# Patient Record
Sex: Male | Born: 1960 | Race: White | Hispanic: No | Marital: Single | Smoking: Current every day smoker
Health system: Southern US, Community
[De-identification: ages and names within clinical notes are randomized; demographics above are authoritative.]

## PROBLEM LIST (undated history)

## (undated) DIAGNOSIS — F329 Major depressive disorder, single episode, unspecified: Secondary | ICD-10-CM

## (undated) DIAGNOSIS — I209 Angina pectoris, unspecified: Secondary | ICD-10-CM

## (undated) DIAGNOSIS — F419 Anxiety disorder, unspecified: Secondary | ICD-10-CM

## (undated) DIAGNOSIS — IMO0001 Reserved for inherently not codable concepts without codable children: Secondary | ICD-10-CM

## (undated) DIAGNOSIS — F32A Depression, unspecified: Secondary | ICD-10-CM

## (undated) DIAGNOSIS — I2510 Atherosclerotic heart disease of native coronary artery without angina pectoris: Secondary | ICD-10-CM

## (undated) DIAGNOSIS — I509 Heart failure, unspecified: Secondary | ICD-10-CM

## (undated) DIAGNOSIS — I639 Cerebral infarction, unspecified: Secondary | ICD-10-CM

## (undated) DIAGNOSIS — I10 Essential (primary) hypertension: Secondary | ICD-10-CM

## (undated) DIAGNOSIS — C801 Malignant (primary) neoplasm, unspecified: Secondary | ICD-10-CM

## (undated) DIAGNOSIS — I219 Acute myocardial infarction, unspecified: Secondary | ICD-10-CM

## (undated) HISTORY — PX: LIVER SURGERY: SHX698

## (undated) HISTORY — PX: CORONARY ANGIOPLASTY WITH STENT PLACEMENT: SHX49

---

## 2005-02-26 ENCOUNTER — Emergency Department: Payer: Self-pay | Admitting: Emergency Medicine

## 2008-09-11 ENCOUNTER — Inpatient Hospital Stay: Payer: Self-pay | Admitting: Specialist

## 2009-06-14 ENCOUNTER — Emergency Department: Payer: Self-pay | Admitting: Emergency Medicine

## 2010-02-09 ENCOUNTER — Emergency Department: Payer: Self-pay | Admitting: Emergency Medicine

## 2010-09-03 ENCOUNTER — Ambulatory Visit: Payer: Self-pay | Admitting: Adult Health

## 2010-09-20 ENCOUNTER — Ambulatory Visit: Payer: Self-pay | Admitting: Adult Health

## 2012-11-11 DIAGNOSIS — E669 Obesity, unspecified: Secondary | ICD-10-CM | POA: Insufficient documentation

## 2012-11-11 DIAGNOSIS — I2510 Atherosclerotic heart disease of native coronary artery without angina pectoris: Secondary | ICD-10-CM | POA: Insufficient documentation

## 2013-10-02 DIAGNOSIS — G459 Transient cerebral ischemic attack, unspecified: Secondary | ICD-10-CM | POA: Insufficient documentation

## 2013-10-02 DIAGNOSIS — I10 Essential (primary) hypertension: Secondary | ICD-10-CM | POA: Insufficient documentation

## 2013-10-02 DIAGNOSIS — M129 Arthropathy, unspecified: Secondary | ICD-10-CM | POA: Insufficient documentation

## 2013-11-29 ENCOUNTER — Ambulatory Visit: Payer: Self-pay | Admitting: Pain Medicine

## 2013-12-02 ENCOUNTER — Ambulatory Visit: Payer: Self-pay | Admitting: Pain Medicine

## 2013-12-02 LAB — MAGNESIUM: Magnesium: 1.9 mg/dL

## 2013-12-02 LAB — SEDIMENTATION RATE: ERYTHROCYTE SED RATE: 4 mm/h (ref 0–20)

## 2013-12-05 ENCOUNTER — Ambulatory Visit: Payer: Self-pay | Admitting: Pain Medicine

## 2013-12-13 ENCOUNTER — Ambulatory Visit: Payer: Self-pay | Admitting: Rheumatology

## 2013-12-26 DIAGNOSIS — G8929 Other chronic pain: Secondary | ICD-10-CM | POA: Insufficient documentation

## 2013-12-30 ENCOUNTER — Ambulatory Visit: Payer: Self-pay | Admitting: Pain Medicine

## 2014-01-08 DIAGNOSIS — R0602 Shortness of breath: Secondary | ICD-10-CM | POA: Insufficient documentation

## 2014-01-08 DIAGNOSIS — I209 Angina pectoris, unspecified: Secondary | ICD-10-CM | POA: Insufficient documentation

## 2014-01-09 ENCOUNTER — Ambulatory Visit: Payer: Self-pay | Admitting: Pain Medicine

## 2014-01-29 ENCOUNTER — Ambulatory Visit: Payer: Self-pay | Admitting: Pain Medicine

## 2014-02-06 ENCOUNTER — Ambulatory Visit: Payer: Self-pay | Admitting: Pain Medicine

## 2014-02-26 ENCOUNTER — Ambulatory Visit: Payer: Self-pay | Admitting: Pain Medicine

## 2014-03-03 DIAGNOSIS — G4700 Insomnia, unspecified: Secondary | ICD-10-CM | POA: Insufficient documentation

## 2014-03-13 ENCOUNTER — Ambulatory Visit: Payer: Self-pay | Admitting: Pain Medicine

## 2014-04-09 ENCOUNTER — Ambulatory Visit: Payer: Self-pay | Admitting: Pain Medicine

## 2014-04-10 ENCOUNTER — Other Ambulatory Visit: Payer: Self-pay | Admitting: Pain Medicine

## 2014-04-10 LAB — HEPATIC FUNCTION PANEL A (ARMC)
ALBUMIN: 3.8 g/dL (ref 3.4–5.0)
AST: 14 U/L — AB (ref 15–37)
Alkaline Phosphatase: 67 U/L
BILIRUBIN TOTAL: 0.3 mg/dL (ref 0.2–1.0)
Bilirubin, Direct: <0.1 mg/dL (ref 0.00–0.20)
SGPT (ALT): 30 U/L
TOTAL PROTEIN: 7.2 g/dL (ref 6.4–8.2)

## 2014-04-10 LAB — BASIC METABOLIC PANEL
Anion Gap: 9 (ref 7–16)
BUN: 13 mg/dL (ref 7–18)
CALCIUM: 8.9 mg/dL (ref 8.5–10.1)
CHLORIDE: 107 mmol/L (ref 98–107)
Co2: 24 mmol/L (ref 21–32)
Creatinine: 0.87 mg/dL (ref 0.60–1.30)
EGFR (African American): >60
EGFR (Non-African Amer.): >60
Glucose: 101 mg/dL — ABNORMAL HIGH (ref 65–99)
OSMOLALITY: 280 (ref 275–301)
Potassium: 3.8 mmol/L (ref 3.5–5.1)
SODIUM: 140 mmol/L (ref 136–145)

## 2014-04-29 ENCOUNTER — Ambulatory Visit: Payer: Self-pay | Admitting: Pain Medicine

## 2014-05-19 ENCOUNTER — Ambulatory Visit: Payer: Self-pay | Admitting: Pain Medicine

## 2014-05-27 ENCOUNTER — Ambulatory Visit: Payer: Self-pay | Admitting: Pain Medicine

## 2014-06-11 ENCOUNTER — Ambulatory Visit: Payer: Self-pay | Admitting: Pain Medicine

## 2014-08-29 ENCOUNTER — Ambulatory Visit: Payer: Self-pay | Admitting: Pain Medicine

## 2014-12-15 DIAGNOSIS — F418 Other specified anxiety disorders: Secondary | ICD-10-CM | POA: Insufficient documentation

## 2015-01-14 DIAGNOSIS — E7849 Other hyperlipidemia: Secondary | ICD-10-CM | POA: Insufficient documentation

## 2015-03-13 ENCOUNTER — Emergency Department: Payer: Medicaid Other

## 2015-03-13 ENCOUNTER — Emergency Department
Admission: EM | Admit: 2015-03-13 | Discharge: 2015-03-13 | Disposition: A | Discharge status: Home / Self Care | Payer: Medicaid Other | Attending: Emergency Medicine | Admitting: Emergency Medicine

## 2015-03-13 DIAGNOSIS — Z7902 Long term (current) use of antithrombotics/antiplatelets: Secondary | ICD-10-CM | POA: Diagnosis not present

## 2015-03-13 DIAGNOSIS — W11XXXA Fall on and from ladder, initial encounter: Secondary | ICD-10-CM | POA: Diagnosis not present

## 2015-03-13 DIAGNOSIS — S3992XA Unspecified injury of lower back, initial encounter: Secondary | ICD-10-CM | POA: Diagnosis not present

## 2015-03-13 DIAGNOSIS — Y998 Other external cause status: Secondary | ICD-10-CM | POA: Diagnosis not present

## 2015-03-13 DIAGNOSIS — S8262XA Displaced fracture of lateral malleolus of left fibula, initial encounter for closed fracture: Secondary | ICD-10-CM | POA: Insufficient documentation

## 2015-03-13 DIAGNOSIS — Z79899 Other long term (current) drug therapy: Secondary | ICD-10-CM | POA: Insufficient documentation

## 2015-03-13 DIAGNOSIS — I10 Essential (primary) hypertension: Secondary | ICD-10-CM | POA: Insufficient documentation

## 2015-03-13 DIAGNOSIS — Y9389 Activity, other specified: Secondary | ICD-10-CM | POA: Diagnosis not present

## 2015-03-13 DIAGNOSIS — Z7951 Long term (current) use of inhaled steroids: Secondary | ICD-10-CM | POA: Insufficient documentation

## 2015-03-13 DIAGNOSIS — Z720 Tobacco use: Secondary | ICD-10-CM | POA: Insufficient documentation

## 2015-03-13 DIAGNOSIS — S99912A Unspecified injury of left ankle, initial encounter: Secondary | ICD-10-CM | POA: Diagnosis present

## 2015-03-13 DIAGNOSIS — Y9289 Other specified places as the place of occurrence of the external cause: Secondary | ICD-10-CM | POA: Insufficient documentation

## 2015-03-13 DIAGNOSIS — S82892A Other fracture of left lower leg, initial encounter for closed fracture: Secondary | ICD-10-CM

## 2015-03-13 HISTORY — DX: Essential (primary) hypertension: I10

## 2015-03-13 HISTORY — DX: Atherosclerotic heart disease of native coronary artery without angina pectoris: I25.10

## 2015-03-13 HISTORY — DX: Cerebral infarction, unspecified: I63.9

## 2015-03-13 HISTORY — DX: Acute myocardial infarction, unspecified: I21.9

## 2015-03-13 MED ORDER — OXYCODONE-ACETAMINOPHEN 5-325 MG PO TABS: 1.0000 | ORAL_TABLET | ORAL | Status: DC | PRN

## 2015-03-13 MED ORDER — ONDANSETRON HCL 4 MG/2ML IJ SOLN
4.0000 mg | Freq: Once | INTRAMUSCULAR | Status: AC
  Administered 2015-03-13: 4 mg via INTRAVENOUS
  Filled 2015-03-13: qty 2

## 2015-03-13 MED ORDER — MORPHINE SULFATE 4 MG/ML IJ SOLN
4.0000 mg | Freq: Once | INTRAMUSCULAR | Status: AC
  Administered 2015-03-13: 4 mg via INTRAVENOUS
  Filled 2015-03-13: qty 1

## 2015-03-13 NOTE — ED Notes (Signed)
Pt discharged home after verbalizing understanding of discharge instructions; nad noted. 

## 2015-03-13 NOTE — ED Provider Notes (Signed)
[facility] Emergency Department Provider Note    ____________________________________________  Time seen: 1020  I have reviewed the triage vital signs and the nursing notes.   HISTORY  Chief Complaint Fall   History limited by: Not Limited   HPI Trevor Rose is a 54 y.o. male who presents to the emergency department today complaining primarily of left ankle pain. The patient states that a couple of hours ago he was up on a ladder when the ladder slipped on the wet grassy surface. He states he fell down the ladder but caught his left ankle. He then started experiencing severe sharp left ankle pain. It does radiate up slightly. He denies hitting his head or blacking out. He denies any head or neck pain. He states that his back pain is his chronic back pain.     Past Medical History  Diagnosis Date  . Hypertension   . Coronary artery disease   . Stroke   . MI (myocardial infarction)     There are no active problems to display for this patient.   Past Surgical History  Procedure Laterality Date  . Coronary angioplasty with stent placement      Current Outpatient Rx  Name  Route  Sig  Dispense  Refill  . acetaminophen (TYLENOL) 500 MG tablet   Oral   Take 1,000 mg by mouth every 6 (six) hours as needed for mild pain.         . budesonide-formoterol (SYMBICORT) 80-4.5 MCG/ACT inhaler   Inhalation   Inhale 2 puffs into the lungs 2 (two) times daily.         . clopidogrel (PLAVIX) 75 MG tablet   Oral   Take 1 tablet by mouth daily.         Marland Kitchen gabapentin (NEURONTIN) 100 MG capsule   Oral   Take 2 capsules by mouth every morning.         Marland Kitchen ipratropium-albuterol (DUONEB) 0.5-2.5 (3) MG/3ML SOLN   Inhalation   Inhale 3 mLs into the lungs 4 (four) times daily as needed.         Marland Kitchen lisinopril (PRINIVIL,ZESTRIL) 5 MG tablet   Oral   Take 1 tablet by mouth daily.         Marland Kitchen lovastatin (MEVACOR) 40 MG tablet   Oral   Take 1 tablet  by mouth daily.           Allergies Review of patient's allergies indicates no known allergies.  No family history on file.  Social History History  Substance Use Topics  . Smoking status: Current Every Day Smoker -- 1.00 packs/day    Types: Cigarettes  . Smokeless tobacco: Not on file  . Alcohol Use: No    Review of Systems  Constitutional: Negative for fever. Cardiovascular: Negative for chest pain. Respiratory: Negative for shortness of breath. Gastrointestinal: Negative for abdominal pain, vomiting and diarrhea. Genitourinary: Negative for dysuria. Musculoskeletal: Positive for back pain, positive for left ankle pain Skin: Negative for rash. Neurological: Negative for headaches, focal weakness or numbness.   10-point ROS otherwise negative.  ____________________________________________   PHYSICAL EXAM:  VITAL SIGNS: ED Triage Vitals  Enc Vitals Group     BP 03/13/15 0916 154/88 mmHg     Pulse Rate 03/13/15 0916 66     Resp 03/13/15 0916 22     Temp 03/13/15 0916 97.8 F (36.6 C)     Temp Source 03/13/15 0916 Oral     SpO2 03/13/15 0916  98 %     Weight 03/13/15 0914 250 lb (113.399 kg)     Height 03/13/15 0914 6\' 1"  (1.854 m)     Head Cir --      Peak Flow --      Pain Score 03/13/15 0914 10   Constitutional: Alert and oriented. Well appearing and in no distress. Eyes: Conjunctivae are normal. PERRL. Normal extraocular movements. ENT   Head: Normocephalic and atraumatic.   Nose: No congestion/rhinnorhea.   Mouth/Throat: Mucous membranes are moist.   Neck: No stridor. No midline tenderness Hematological/Lymphatic/Immunilogical: No cervical lymphadenopathy. Cardiovascular: Normal rate, regular rhythm.  No murmurs, rubs, or gallops. Respiratory: Normal respiratory effort without tachypnea nor retractions. Breath sounds are clear and equal bilaterally. No wheezes/rales/rhonchi. Gastrointestinal: Soft and nontender. No distention. There is no  CVA tenderness. Genitourinary: Deferred Musculoskeletal: Swelling and tenderness about the left ankle worse on the lateral aspect. DP 2+. Able to wiggle all toes. Sensation intact. No tenderness to proximal tib/fib. No other obvious traumatic injury to the extremities. Pelvis stable. Neurologic:  Normal speech and language. No gross focal neurologic deficits are appreciated. Speech is normal.  Skin:  Skin is warm, dry and intact. No rash noted. Psychiatric: Mood and affect are normal. Speech and behavior are normal. Patient exhibits appropriate insight and judgment.  ____________________________________________    LABS (pertinent positives/negatives)  None  ____________________________________________   EKG  None  ____________________________________________    RADIOLOGY  RADIOLOGY INTERPRETATION  I, Nance Pear, attending physician, personally viewed and interpreted these images: Location: Left Ankle Type of study: Plain film Number of views: 3 Pertinent positive/negative findings: Distal fibula fracture Clinical Impression: Distal fibula fracture   ____________________________________________   PROCEDURES  Procedure(s) performed: Post-splint check, see procedure note(s).  Critical Care performed: No   POST SPLINT CHECK Right ankle splint applied by tech.  Good position.  Distally N/V intact, sensation intact. No discoloration.   ____________________________________________   INITIAL IMPRESSION / ASSESSMENT AND PLAN / ED COURSE  Pertinent labs & imaging results that were available during my care of the patient were reviewed by me and considered in my medical decision making (see chart for details).  Patient presents to the emergency department today with left ankle pain after fall. X-rays show lateral malleolus fracture. Splint applied by tech. Will have patient follow up with orthopedics.   ____________________________________________   FINAL  CLINICAL IMPRESSION(S) / ED DIAGNOSES  Final diagnoses:  Ankle fracture, left, closed, initial encounter     Nance Pear, MD 03/13/15 1247

## 2015-03-13 NOTE — ED Notes (Signed)
Pt states he was at the second story level on a ladder pressure washing a house and fell down the ladder and got left foot caught in ladder on the way down and is having left ankle and lower back pain

## 2015-03-13 NOTE — ED Notes (Signed)
Resumed care from [facility]. Pt resting and requests pain medication.

## 2015-03-13 NOTE — Discharge Instructions (Signed)
Please seek medical attention for any high fevers, chest pain, shortness of breath, change in behavior, persistent vomiting, bloody stool or any other new or concerning symptoms.  Ankle Fracture A fracture is a break in a bone. The ankle joint is made up of three bones. These include the lower (distal)sections of your lower leg bones, called the tibia and fibula, along with a bone in your foot, called the talus. Depending on how bad the break is and if more than one ankle joint bone is broken, a cast or splint is used to protect and keep your injured bone from moving while it heals. Sometimes, surgery is required to help the fracture heal properly.  There are two general types of fractures:  Stable fracture. This includes a single fracture line through one bone, with no injury to ankle ligaments. A fracture of the talus that does not have any displacement (movement of the bone on either side of the fracture line) is also stable.  Unstable fracture. This includes more than one fracture line through one or more bones in the ankle joint. It also includes fractures that have displacement of the bone on either side of the fracture line. CAUSES  A direct blow to the ankle.   Quickly and severely twisting your ankle.  Trauma, such as a car accident or falling from a significant height. RISK FACTORS You may be at a higher risk of ankle fracture if:  You have certain medical conditions.  You are involved in high-impact sports.  You are involved in a high-impact car accident. SIGNS AND SYMPTOMS   Tender and swollen ankle.  Bruising around the injured ankle.  Pain on movement of the ankle.  Difficulty walking or putting weight on the ankle.  A cold foot below the site of the ankle injury. This can occur if the blood vessels passing through your injured ankle were also damaged.  Numbness in the foot below the site of the ankle injury. DIAGNOSIS  An ankle fracture is usually diagnosed with  a physical exam and X-rays. A CT scan may also be required for complex fractures. TREATMENT  Stable fractures are treated with a cast or splint and using crutches to avoid putting weight on your injured ankle. This is followed by an ankle strengthening program. Some patients require a special type of cast, depending on other medical problems they may have. Unstable fractures require surgery to ensure the bones heal properly. Your health care provider will tell you what type of fracture you have and the best treatment for your condition. HOME CARE INSTRUCTIONS   Review correct crutch use with your health care provider and use your crutches as directed. Safe use of crutches is extremely important. Misuse of crutches can cause you to fall or cause injury to nerves in your hands or armpits.  Do not put weight or pressure on the injured ankle until directed by your health care provider.  To lessen the swelling, keep the injured leg elevated while sitting or lying down.  Apply ice to the injured area:  Put ice in a plastic bag.  Place a towel between your cast and the bag.  Leave the ice on for 20 minutes, 2-3 times a day.  If you have a plaster or fiberglass cast:  Do not try to scratch the skin under the cast with any objects. This can increase your risk of skin infection.  Check the skin around the cast every day. You may put lotion on any red or  sore areas.  Keep your cast dry and clean.  If you have a plaster splint:  Wear the splint as directed.  You may loosen the elastic around the splint if your toes become numb, tingle, or turn cold or blue.  Do not put pressure on any part of your cast or splint; it may break. Rest your cast only on a pillow the first 24 hours until it is fully hardened.  Your cast or splint can be protected during bathing with a plastic bag sealed to your skin with medical tape. Do not lower the cast or splint into water.  Take medicines as directed by your  health care provider. Only take over-the-counter or prescription medicines for pain, discomfort, or fever as directed by your health care provider.  Do not drive a vehicle until your health care provider specifically tells you it is safe to do so.  If your health care provider has given you a follow-up appointment, it is very important to keep that appointment. Not keeping the appointment could result in a chronic or permanent injury, pain, and disability. If you have any problem keeping the appointment, call the facility for assistance. SEEK MEDICAL CARE IF: You develop increased swelling or discomfort. SEEK IMMEDIATE MEDICAL CARE IF:   Your cast gets damaged or breaks.  You have continued severe pain.  You develop new pain or swelling after the cast was put on.  Your skin or toenails below the injury turn blue or gray.  Your skin or toenails below the injury feel cold, numb, or have loss of sensitivity to touch.  There is a bad smell or pus draining from under the cast. MAKE SURE YOU:   Understand these instructions.  Will watch your condition.  Will get help right away if you are not doing well or get worse. Document Released: 08/05/2000 Document Revised: 08/13/2013 Document Reviewed: 03/07/2013 [facility] Patient Information 2015 [place], [place]. This information is not intended to replace advice given to you by your health care provider. Make sure you discuss any questions you have with your health care provider.

## 2015-05-19 ENCOUNTER — Other Ambulatory Visit: Payer: Self-pay | Admitting: Student

## 2015-05-19 DIAGNOSIS — R9389 Abnormal findings on diagnostic imaging of other specified body structures: Secondary | ICD-10-CM

## 2015-06-01 ENCOUNTER — Other Ambulatory Visit: Payer: Self-pay | Admitting: Radiology

## 2015-06-02 ENCOUNTER — Ambulatory Visit
Admission: RE | Admit: 2015-06-02 | Discharge: 2015-06-02 | Disposition: A | Discharge status: Home / Self Care | Payer: Medicaid Other | Source: Ambulatory Visit | Attending: Student | Admitting: Student

## 2015-06-02 DIAGNOSIS — C7B8 Other secondary neuroendocrine tumors: Secondary | ICD-10-CM | POA: Diagnosis not present

## 2015-06-02 DIAGNOSIS — I77811 Abdominal aortic ectasia: Secondary | ICD-10-CM | POA: Diagnosis not present

## 2015-06-02 DIAGNOSIS — R938 Abnormal findings on diagnostic imaging of other specified body structures: Secondary | ICD-10-CM | POA: Diagnosis present

## 2015-06-02 DIAGNOSIS — R9389 Abnormal findings on diagnostic imaging of other specified body structures: Secondary | ICD-10-CM

## 2015-06-02 DIAGNOSIS — R109 Unspecified abdominal pain: Secondary | ICD-10-CM | POA: Diagnosis not present

## 2015-06-02 HISTORY — DX: Reserved for inherently not codable concepts without codable children: IMO0001

## 2015-06-02 HISTORY — DX: Anxiety disorder, unspecified: F41.9

## 2015-06-02 HISTORY — DX: Angina pectoris, unspecified: I20.9

## 2015-06-02 HISTORY — DX: Depression, unspecified: F32.A

## 2015-06-02 HISTORY — DX: Major depressive disorder, single episode, unspecified: F32.9

## 2015-06-02 LAB — PROTIME-INR
INR: 1.07
Prothrombin Time: 14.1 seconds (ref 11.4–15.0)

## 2015-06-02 LAB — CBC
HEMATOCRIT: 44.4 % (ref 40.0–52.0)
HEMOGLOBIN: 14.8 g/dL (ref 13.0–18.0)
MCH: 31.9 pg (ref 26.0–34.0)
MCHC: 33.5 g/dL (ref 32.0–36.0)
MCV: 95.2 fL (ref 80.0–100.0)
PLATELETS: 205 10*3/uL (ref 150–440)
RBC: 4.66 MIL/uL (ref 4.40–5.90)
RDW: 13.9 % (ref 11.5–14.5)
WBC: 6.7 10*3/uL (ref 3.8–10.6)

## 2015-06-02 LAB — APTT: aPTT: 29 seconds (ref 24–36)

## 2015-06-02 MED ORDER — MIDAZOLAM HCL 2 MG/2ML IJ SOLN
INTRAMUSCULAR | Status: AC | PRN
  Administered 2015-06-02 (×3): 1 mg via INTRAVENOUS

## 2015-06-02 MED ORDER — FENTANYL CITRATE (PF) 100 MCG/2ML IJ SOLN
INTRAMUSCULAR | Status: AC | PRN
  Administered 2015-06-02 (×2): 50 ug via INTRAVENOUS

## 2015-06-02 MED ORDER — MIDAZOLAM HCL 5 MG/5ML IJ SOLN
INTRAMUSCULAR | Status: AC
  Filled 2015-06-02: qty 5

## 2015-06-02 MED ORDER — HYDROCODONE-ACETAMINOPHEN 5-325 MG PO TABS
1.0000 | ORAL_TABLET | ORAL | Status: DC | PRN
  Administered 2015-06-02: 2 via ORAL

## 2015-06-02 MED ORDER — HYDROCODONE-ACETAMINOPHEN 5-325 MG PO TABS
ORAL_TABLET | ORAL | Status: AC
  Administered 2015-06-02: 2 via ORAL
  Filled 2015-06-02: qty 2

## 2015-06-02 MED ORDER — SODIUM CHLORIDE 0.9 % IV SOLN
Freq: Once | INTRAVENOUS | Status: AC
  Administered 2015-06-02: 09:00:00 via INTRAVENOUS

## 2015-06-02 MED ORDER — FENTANYL CITRATE (PF) 100 MCG/2ML IJ SOLN
INTRAMUSCULAR | Status: AC
  Filled 2015-06-02: qty 2

## 2015-06-02 NOTE — Discharge Instructions (Signed)
Needle Biopsy, Care After °Refer to this sheet in the next few weeks. These instructions provide you with information about caring for yourself after your procedure. Your health care provider may also give you more specific instructions. Your treatment has been planned according to current medical practices, but problems sometimes occur. Call your health care provider if you have any problems or questions after your procedure. °WHAT TO EXPECT AFTER THE PROCEDURE °After your procedure, it is common to have soreness, bruising, or mild pain at the biopsy site. This should go away in a few days. °HOME CARE INSTRUCTIONS °· Rest as directed by your health care provider. °· Take medicines only as directed by your health care provider. °· There are many different ways to close and cover the biopsy site, including stitches (sutures), skin glue, and adhesive strips. Follow your health care provider's instructions about: °¨ Biopsy site care. °¨ Bandage (dressing) changes and removal. °¨ Biopsy site closure removal. °· Check your biopsy site every day for signs of infection. Watch for: °¨ Redness, swelling, or pain. °¨ Fluid, blood, or pus. °SEEK MEDICAL CARE IF: °· You have a fever. °· You have redness, swelling, or pain at the biopsy site that lasts longer than a few days. °· You have fluid, blood, or pus coming from the biopsy site. °· You feel nauseous. °· You vomit. °SEEK IMMEDIATE MEDICAL CARE IF: °· You have shortness of breath. °· You have trouble breathing. °· You have chest pain.   °· You feel dizzy or you faint. °· You have bleeding that does not stop with pressure or a bandage. °· You cough up blood. °· You have pain in your abdomen. °  °This information is not intended to replace advice given to you by your health care provider. Make sure you discuss any questions you have with your health care provider. °  °Document Released: 12/23/2014 Document Reviewed: 12/23/2014 °Elsevier Interactive Patient Education ©2016  [employer] ° °

## 2015-06-02 NOTE — Procedures (Signed)
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 See complete dictation in [facility].

## 2015-06-02 NOTE — OR Nursing (Signed)
Dr rein office called regarding fact that pt out of pain medication. Message to get to Dr And they will call pt back.

## 2015-06-02 NOTE — OR Nursing (Signed)
Brief episode of bradycardia rate in 20s, when holding breath during biopsy sampling. mentating well. Resolved spontaneously

## 2015-06-04 LAB — SURGICAL PATHOLOGY

## 2015-06-10 DIAGNOSIS — K6389 Other specified diseases of intestine: Secondary | ICD-10-CM | POA: Insufficient documentation

## 2015-06-26 DIAGNOSIS — J4489 Other specified chronic obstructive pulmonary disease: Secondary | ICD-10-CM | POA: Insufficient documentation

## 2015-06-26 DIAGNOSIS — Z8673 Personal history of transient ischemic attack (TIA), and cerebral infarction without residual deficits: Secondary | ICD-10-CM | POA: Insufficient documentation

## 2015-06-26 DIAGNOSIS — J449 Chronic obstructive pulmonary disease, unspecified: Secondary | ICD-10-CM | POA: Insufficient documentation

## 2015-07-02 DIAGNOSIS — Z01810 Encounter for preprocedural cardiovascular examination: Secondary | ICD-10-CM | POA: Insufficient documentation

## 2015-07-06 DIAGNOSIS — Z87891 Personal history of nicotine dependence: Secondary | ICD-10-CM | POA: Insufficient documentation

## 2015-10-30 ENCOUNTER — Other Ambulatory Visit: Payer: Self-pay | Admitting: Nurse Practitioner

## 2015-10-30 DIAGNOSIS — R29810 Facial weakness: Secondary | ICD-10-CM

## 2015-10-30 DIAGNOSIS — R1312 Dysphagia, oropharyngeal phase: Secondary | ICD-10-CM

## 2015-10-30 DIAGNOSIS — R519 Headache, unspecified: Secondary | ICD-10-CM

## 2015-10-30 DIAGNOSIS — R51 Headache: Secondary | ICD-10-CM

## 2015-10-30 DIAGNOSIS — Z8673 Personal history of transient ischemic attack (TIA), and cerebral infarction without residual deficits: Secondary | ICD-10-CM

## 2015-11-02 ENCOUNTER — Emergency Department: Payer: Medicaid Other

## 2015-11-02 ENCOUNTER — Encounter: Payer: Self-pay | Admitting: Emergency Medicine

## 2015-11-02 ENCOUNTER — Emergency Department
Admission: EM | Admit: 2015-11-02 | Discharge: 2015-11-02 | Disposition: A | Discharge status: Home / Self Care | Payer: Medicaid Other | Attending: Emergency Medicine | Admitting: Emergency Medicine

## 2015-11-02 DIAGNOSIS — Z79899 Other long term (current) drug therapy: Secondary | ICD-10-CM | POA: Diagnosis not present

## 2015-11-02 DIAGNOSIS — Z7982 Long term (current) use of aspirin: Secondary | ICD-10-CM | POA: Insufficient documentation

## 2015-11-02 DIAGNOSIS — G8929 Other chronic pain: Secondary | ICD-10-CM | POA: Insufficient documentation

## 2015-11-02 DIAGNOSIS — Y9389 Activity, other specified: Secondary | ICD-10-CM | POA: Insufficient documentation

## 2015-11-02 DIAGNOSIS — F17210 Nicotine dependence, cigarettes, uncomplicated: Secondary | ICD-10-CM | POA: Insufficient documentation

## 2015-11-02 DIAGNOSIS — Y92410 Unspecified street and highway as the place of occurrence of the external cause: Secondary | ICD-10-CM | POA: Diagnosis not present

## 2015-11-02 DIAGNOSIS — M5441 Lumbago with sciatica, right side: Secondary | ICD-10-CM | POA: Diagnosis not present

## 2015-11-02 DIAGNOSIS — Z7901 Long term (current) use of anticoagulants: Secondary | ICD-10-CM | POA: Diagnosis not present

## 2015-11-02 DIAGNOSIS — Z7951 Long term (current) use of inhaled steroids: Secondary | ICD-10-CM | POA: Insufficient documentation

## 2015-11-02 DIAGNOSIS — Y998 Other external cause status: Secondary | ICD-10-CM | POA: Diagnosis not present

## 2015-11-02 DIAGNOSIS — I10 Essential (primary) hypertension: Secondary | ICD-10-CM | POA: Diagnosis not present

## 2015-11-02 DIAGNOSIS — S20211A Contusion of right front wall of thorax, initial encounter: Secondary | ICD-10-CM | POA: Diagnosis not present

## 2015-11-02 DIAGNOSIS — S29001A Unspecified injury of muscle and tendon of front wall of thorax, initial encounter: Secondary | ICD-10-CM | POA: Diagnosis present

## 2015-11-02 MED ORDER — PREDNISONE 10 MG PO TABS: ORAL_TABLET | ORAL | Status: DC
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
## 2015-11-04 ENCOUNTER — Ambulatory Visit (HOSPITAL_COMMUNITY): Payer: Medicaid Other

## 2015-11-20 ENCOUNTER — Ambulatory Visit
Admission: RE | Admit: 2015-11-20 | Discharge: 2015-11-20 | Disposition: A | Discharge status: Home / Self Care | Payer: Medicaid Other | Source: Ambulatory Visit | Attending: Nurse Practitioner | Admitting: Nurse Practitioner

## 2015-11-20 DIAGNOSIS — R51 Headache: Secondary | ICD-10-CM

## 2015-11-20 DIAGNOSIS — R9089 Other abnormal findings on diagnostic imaging of central nervous system: Secondary | ICD-10-CM | POA: Insufficient documentation

## 2015-11-20 DIAGNOSIS — Z8673 Personal history of transient ischemic attack (TIA), and cerebral infarction without residual deficits: Secondary | ICD-10-CM | POA: Diagnosis present

## 2015-11-20 DIAGNOSIS — R519 Headache, unspecified: Secondary | ICD-10-CM

## 2015-11-20 DIAGNOSIS — R29810 Facial weakness: Secondary | ICD-10-CM

## 2015-11-20 DIAGNOSIS — R1312 Dysphagia, oropharyngeal phase: Secondary | ICD-10-CM

## 2015-11-20 LAB — POCT I-STAT CREATININE: CREATININE: 0.8 mg/dL (ref 0.61–1.24)

## 2015-11-20 MED ORDER — GADOBENATE DIMEGLUMINE 529 MG/ML IV SOLN
20.0000 mL | Freq: Once | INTRAVENOUS | Status: AC | PRN
  Administered 2015-11-20: 20 mL via INTRAVENOUS

## 2016-01-05 DIAGNOSIS — C7A8 Other malignant neuroendocrine tumors: Secondary | ICD-10-CM | POA: Insufficient documentation

## 2016-11-12 ENCOUNTER — Emergency Department: Payer: Medicaid Other

## 2016-11-12 ENCOUNTER — Emergency Department
Admission: EM | Admit: 2016-11-12 | Discharge: 2016-11-12 | Disposition: A | Discharge status: Home / Self Care | Payer: Medicaid Other | Attending: Emergency Medicine | Admitting: Emergency Medicine

## 2016-11-12 ENCOUNTER — Encounter: Payer: Self-pay | Admitting: *Deleted

## 2016-11-12 DIAGNOSIS — I2510 Atherosclerotic heart disease of native coronary artery without angina pectoris: Secondary | ICD-10-CM | POA: Insufficient documentation

## 2016-11-12 DIAGNOSIS — F17210 Nicotine dependence, cigarettes, uncomplicated: Secondary | ICD-10-CM | POA: Insufficient documentation

## 2016-11-12 DIAGNOSIS — I252 Old myocardial infarction: Secondary | ICD-10-CM | POA: Diagnosis not present

## 2016-11-12 DIAGNOSIS — R55 Syncope and collapse: Secondary | ICD-10-CM | POA: Diagnosis present

## 2016-11-12 DIAGNOSIS — R1084 Generalized abdominal pain: Secondary | ICD-10-CM

## 2016-11-12 DIAGNOSIS — I110 Hypertensive heart disease with heart failure: Secondary | ICD-10-CM | POA: Insufficient documentation

## 2016-11-12 DIAGNOSIS — Z7982 Long term (current) use of aspirin: Secondary | ICD-10-CM | POA: Diagnosis not present

## 2016-11-12 DIAGNOSIS — Z8505 Personal history of malignant neoplasm of liver: Secondary | ICD-10-CM | POA: Insufficient documentation

## 2016-11-12 DIAGNOSIS — I509 Heart failure, unspecified: Secondary | ICD-10-CM | POA: Insufficient documentation

## 2016-11-12 DIAGNOSIS — E860 Dehydration: Secondary | ICD-10-CM | POA: Diagnosis not present

## 2016-11-12 HISTORY — DX: Heart failure, unspecified: I50.9

## 2016-11-12 HISTORY — DX: Malignant (primary) neoplasm, unspecified: C80.1

## 2016-11-12 LAB — URINALYSIS, COMPLETE (UACMP) WITH MICROSCOPIC
Bilirubin Urine: NEGATIVE
Glucose, UA: NEGATIVE mg/dL
Hgb urine dipstick: NEGATIVE
Ketones, ur: NEGATIVE mg/dL
Leukocytes, UA: NEGATIVE
Nitrite: NEGATIVE
PROTEIN: NEGATIVE mg/dL
RBC / HPF: NONE SEEN RBC/hpf (ref 0–5)
Specific Gravity, Urine: 1.026 (ref 1.005–1.030)
pH: 6 (ref 5.0–8.0)

## 2016-11-12 LAB — ACETAMINOPHEN LEVEL: Acetaminophen (Tylenol), Serum: 12 ug/mL (ref 10–30)

## 2016-11-12 LAB — BASIC METABOLIC PANEL
Anion gap: 8 (ref 5–15)
BUN: 12 mg/dL (ref 6–20)
CO2: 28 mmol/L (ref 22–32)
CREATININE: 0.74 mg/dL (ref 0.61–1.24)
Calcium: 9.4 mg/dL (ref 8.9–10.3)
Chloride: 104 mmol/L (ref 101–111)
GFR calc Af Amer: >60 mL/min (ref >60)
Glucose, Bld: 117 mg/dL — ABNORMAL HIGH (ref 65–99)
POTASSIUM: 4.2 mmol/L (ref 3.5–5.1)
Sodium: 140 mmol/L (ref 135–145)

## 2016-11-12 LAB — LIPASE, BLOOD: LIPASE: 12 U/L (ref 11–51)

## 2016-11-12 LAB — LACTIC ACID, PLASMA: Lactic Acid, Venous: 1.9 mmol/L (ref 0.5–1.9)

## 2016-11-12 LAB — CBC WITH DIFFERENTIAL/PLATELET
BASOS ABS: 0 10*3/uL (ref 0–0.1)
BASOS PCT: 0 %
Eosinophils Absolute: 0 10*3/uL (ref 0–0.7)
Eosinophils Relative: 1 %
HCT: 42.4 % (ref 40.0–52.0)
Hemoglobin: 14.5 g/dL (ref 13.0–18.0)
LYMPHS PCT: 28 %
Lymphs Abs: 2.7 10*3/uL (ref 1.0–3.6)
MCH: 32.9 pg (ref 26.0–34.0)
MCHC: 34.3 g/dL (ref 32.0–36.0)
MCV: 96 fL (ref 80.0–100.0)
Monocytes Absolute: 0.6 10*3/uL (ref 0.2–1.0)
Monocytes Relative: 6 %
Neutro Abs: 6.3 10*3/uL (ref 1.4–6.5)
Neutrophils Relative %: 65 %
PLATELETS: 215 10*3/uL (ref 150–440)
RBC: 4.42 MIL/uL (ref 4.40–5.90)
RDW: 14.4 % (ref 11.5–14.5)
WBC: 9.7 10*3/uL (ref 3.8–10.6)

## 2016-11-12 LAB — HEPATIC FUNCTION PANEL
ALT: 19 U/L (ref 17–63)
AST: 21 U/L (ref 15–41)
Albumin: 4.7 g/dL (ref 3.5–5.0)
Alkaline Phosphatase: 62 U/L (ref 38–126)
Bilirubin, Direct: <0.1 mg/dL — ABNORMAL LOW (ref 0.1–0.5)
TOTAL PROTEIN: 7.8 g/dL (ref 6.5–8.1)
Total Bilirubin: 0.6 mg/dL (ref 0.3–1.2)

## 2016-11-12 LAB — AMMONIA

## 2016-11-12 LAB — TROPONIN I

## 2016-11-12 LAB — SALICYLATE LEVEL

## 2016-11-12 LAB — ETHANOL

## 2016-11-12 MED ORDER — SODIUM CHLORIDE 0.9 % IV BOLUS (SEPSIS)
1000.0000 mL | Freq: Once | INTRAVENOUS | Status: AC
  Administered 2016-11-12: 1000 mL via INTRAVENOUS

## 2016-11-12 MED ORDER — IOPAMIDOL (ISOVUE-300) INJECTION 61%
30.0000 mL | Freq: Once | INTRAVENOUS | Status: AC | PRN
  Administered 2016-11-12: 30 mL via ORAL

## 2016-11-12 MED ORDER — VANCOMYCIN HCL IN DEXTROSE 1-5 GM/200ML-% IV SOLN
1000.0000 mg | Freq: Once | INTRAVENOUS | Status: AC
  Administered 2016-11-12: 1000 mg via INTRAVENOUS
  Filled 2016-11-12: qty 200

## 2016-11-12 MED ORDER — IOPAMIDOL (ISOVUE-300) INJECTION 61%
100.0000 mL | Freq: Once | INTRAVENOUS | Status: AC | PRN
  Administered 2016-11-12: 100 mL via INTRAVENOUS

## 2016-11-12 MED ORDER — LORAZEPAM 1 MG PO TABS
1.0000 mg | ORAL_TABLET | Freq: Once | ORAL | Status: AC
  Administered 2016-11-12: 1 mg via ORAL
  Filled 2016-11-12: qty 1

## 2016-11-12 MED ORDER — PIPERACILLIN-TAZOBACTAM 3.375 G IVPB 30 MIN
3.3750 g | Freq: Once | INTRAVENOUS | Status: AC
  Administered 2016-11-12: 3.375 g via INTRAVENOUS
  Filled 2016-11-12: qty 50

## 2016-11-12 NOTE — ED Triage Notes (Signed)
Pt to ED from home after having reported stomach pain to family and having a syncopal episode. Pt had a HR of 30 upon EMS assessment. Pt was reported to be lethargic but responsive and oriented. Upon arrival to ED pt is shivering and reporting feeling cold. Pt is alert and oriented with HR of 78 NSR.   Pt has hx of liver cancer, unknown heart problem, and stroke. Pt is reported to be on Vicodin but denies having taken more than prescribed.

## 2016-11-12 NOTE — ED Notes (Signed)
Pt in CT. Family no longer in room.

## 2016-11-12 NOTE — ED Provider Notes (Addendum)
[facility] Emergency Department Provider Note  ____________________________________________  Time seen: Approximately 5:38 PM  I have reviewed the triage vital signs and the nursing notes.   HISTORY  Chief Complaint Bradycardia and Loss of Consciousness    HPI CANTON YEARBY is a 56 y.o. male brought in by EMS due to generalized weakness and abdominal pain that started gradually this morning and has worsened throughout the afternoon. Also had syncope at home. EMS found bradycardia with a heart rate of 30 when they arrived. Patient has remained awake and alert. Patient complains of feeling cold as well. Denies any other pain or recent illness. No shortness of breath or cough. No diarrhea or vomiting. He said he normally does not eat very well because of his liver cancer and drinks boost or insure for nutrition. Has been taking oral intake as usual.  Abdominal pain as moderate intensity, nonradiating, no aggravating or alleviating factors.   Past Medical History:  Diagnosis Date  . Anginal pain ([place])   . Anxiety   . Cancer ([place])    liver  . CHF (congestive heart failure) ([place])   . Coronary artery disease   . Depression   . Hypertension   . MI (myocardial infarction)   . Shortness of breath dyspnea   . Stroke [facility])      There are no active problems to display for this patient.    Past Surgical History:  Procedure Laterality Date  . CORONARY ANGIOPLASTY WITH STENT PLACEMENT       Prior to Admission medications   Medication Sig Start Date End Date Taking? Authorizing Provider  ALPRAZolam Duanne Moron) 1 MG tablet Take 1 mg by mouth 3 (three) times daily.    Yes Historical Provider, MD  aspirin EC 81 MG tablet Take 81 mg by mouth daily.   Yes Historical Provider, MD  budesonide-formoterol (SYMBICORT) 80-4.5 MCG/ACT inhaler Inhale 2 puffs into the lungs 2 (two) times daily.   Yes Historical Provider, MD  clopidogrel (PLAVIX) 75 MG tablet Take 1 tablet by  mouth daily.   Yes Historical Provider, MD  gabapentin (NEURONTIN) 300 MG capsule Take 300 mg by mouth 3 (three) times daily.    Yes Historical Provider, MD  HYDROcodone-acetaminophen (NORCO) 10-325 MG tablet Take 2 tablets by mouth every 4 (four) hours as needed for moderate pain.    Yes Historical Provider, MD  ipratropium-albuterol (DUONEB) 0.5-2.5 (3) MG/3ML SOLN Inhale 3 mLs into the lungs 4 (four) times daily as needed.   Yes Historical Provider, MD  lisinopril (PRINIVIL,ZESTRIL) 5 MG tablet Take 1 tablet by mouth daily.   Yes Historical Provider, MD  lovastatin (MEVACOR) 40 MG tablet Take 1 tablet by mouth daily.   Yes Historical Provider, MD  predniSONE (DELTASONE) 10 MG tablet Take a daily regimen of 6,5,4,3,2,1 Patient not taking: Reported on 11/12/2016 11/02/15   Jami L Hagler, PA-C     Allergies Patient has no known allergies.   History reviewed. No pertinent family history.  Social History Social History  Substance Use Topics  . Smoking status: Current Every Day Smoker    Packs/day: 1.00    Types: Cigarettes  . Smokeless tobacco: Never Used  . Alcohol use No    Review of Systems  Constitutional:   No fever Positive chills.  ENT:   No sore throat. No rhinorrhea. Cardiovascular:   No chest pain. Respiratory:   No dyspnea or cough. Gastrointestinal:  Positive generalized abdominal pain as above without vomiting and diarrhea.  Genitourinary:  Negative for dysuria or difficulty urinating. Musculoskeletal:   Negative for focal pain or swelling Neurological:   Negative for headaches 10-point ROS otherwise negative.  ____________________________________________   PHYSICAL EXAM:  VITAL SIGNS: ED Triage Vitals  Enc Vitals Group     BP 11/12/16 1545 (!) 143/80     Pulse Rate 11/12/16 1528 (!) 45     Resp 11/12/16 1528 13     Temp 11/12/16 1528 (!) 95.9 F (35.5 C)     Temp Source 11/12/16 1528 Rectal     SpO2 11/12/16 1528 97 %     Weight 11/12/16 1622 190 lb  (86.2 kg)     Height 11/12/16 1653 5\' 11"  (1.803 m)     Head Circumference --      Peak Flow --      Pain Score --      Pain Loc --      Pain Edu? --      Excl. in [place]? --     Vital signs reviewed, nursing assessments reviewed.   Constitutional:   Alert and oriented. Ill-appearing Eyes:   No scleral icterus. No conjunctival pallor. PERRL. EOMI.  No nystagmus. ENT   Head:   Normocephalic and atraumatic.   Nose:   No congestion/rhinnorhea. No septal hematoma   Mouth/Throat:   Dry mucous membranes, no pharyngeal erythema. No peritonsillar mass.    Neck:   No stridor. No SubQ emphysema. No meningismus. Hematological/Lymphatic/Immunilogical:   No cervical lymphadenopathy. Cardiovascular:   Bradycardia heart rate 40-50. Symmetric bilateral radial and DP pulses.  No murmurs.  Respiratory:   Normal respiratory effort without tachypnea nor retractions. Breath sounds are clear and equal bilaterally. No wheezes/rales/rhonchi. Gastrointestinal:   Soft with generalized tenderness. Non distended. There is no CVA tenderness.  No rebound, rigidity, or guarding. Rectal exam reveals brown stool, Hemoccult negative, controls okay Genitourinary:   Normal Musculoskeletal:   Normal range of motion in all extremities. No joint effusions.  No lower extremity tenderness.  No edema. Neurologic:   Normal speech and language.  CN 2-10 normal. Motor grossly intact. No gross focal neurologic deficits are appreciated.  Skin:    Skin is warm, dry and intact. No rash noted.  No petechiae, purpura, or bullae.  ____________________________________________    LABS (pertinent positives/negatives) (all labs ordered are listed, but only abnormal results are displayed) Labs Reviewed  BASIC METABOLIC PANEL - Abnormal; Notable for the following:       Result Value   Glucose, Bld 117 (*)    All other components within normal limits  HEPATIC FUNCTION PANEL - Abnormal; Notable for the following:     Bilirubin, Direct <0.1 (*)    All other components within normal limits  URINALYSIS, COMPLETE (UACMP) WITH MICROSCOPIC - Abnormal; Notable for the following:    Color, Urine YELLOW (*)    APPearance CLEAR (*)    Bacteria, UA RARE (*)    Squamous Epithelial / LPF 0-5 (*)    All other components within normal limits  AMMONIA - Abnormal; Notable for the following:    Ammonia <9 (*)    All other components within normal limits  URINE CULTURE  CULTURE, BLOOD (ROUTINE X 2)  CULTURE, BLOOD (ROUTINE X 2)  LIPASE, BLOOD  ETHANOL  TROPONIN I  SALICYLATE LEVEL  ACETAMINOPHEN LEVEL  CBC WITH DIFFERENTIAL/PLATELET  LACTIC ACID, PLASMA   ____________________________________________   EKG  Interpreted by me Sinus bradycardia rate of 42, normal axis. Normal intervals. Normal QRS. Normal ST segments  and T waves.  Repeat EKG performed at 1606, interpreted by me Sinus bradycardia rate of 41, no acute interval changes.  ____________________________________________    RADIOLOGY  Ct Abdomen Pelvis W Contrast  Result Date: 11/12/2016 CLINICAL DATA:  56 year old male with severe abdominal pain. Pain and nausea today. 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 EXAM: CT ABDOMEN AND PELVIS WITH CONTRAST TECHNIQUE: Multidetector CT imaging of the abdomen and pelvis was performed using the standard protocol following bolus administration of intravenous contrast. CONTRAST:  165mL ISOVUE-300 IOPAMIDOL (ISOVUE-300) INJECTION 61% COMPARISON:  Lumbar MRI 12/13/2013 FINDINGS: Lower chest: No pericardial or pleural effusion. Dependent patchy opacity at both lung bases, and the lingula, most resembling atelectasis. No lung base pulmonary nodule. Hepatobiliary: Numerous round hypodense liver lesions, measuring 12 mm diameter or less. Right lobe predominance. No associated mass effect within the liver. Evidence of underlying mild hepatic steatosis. Negative gallbladder. No biliary ductal enlargement.  Pancreas: Negative. Spleen: Negative. Adrenals/Urinary Tract: Normal adrenal glands. Bilateral renal enhancement and contrast excretion is normal. No abdominal free fluid. Diminutive and unremarkable urinary bladder. Stomach/Bowel: There is a long thin mixed density, partially metal density, foreign body in the anus (sagittal image 69). This extends to just below the level of the pelvic floor. Negative rectum. Mildly redundant sigmoid colon with occasional diverticula but no active inflammation. Negative left colon and splenic flexure. Mildly redundant hepatic flexure and mild retained stool. Negative right colon and appendix. Negative terminal ileum. Oral contrast has not yet reached the distal small bowel. No dilated or abnormal small bowel loops. Negative stomach and duodenum. Vascular/Lymphatic: Extensive soft and calcified atherosclerosis of the distal abdominal aorta. More calcified bilateral iliac artery atherosclerosis. Major arterial structures remain patent. The portal venous system is patent. No lymphadenopathy. Reproductive: Negative. Other: No pelvic free fluid. Musculoskeletal: Multiple round sclerotic lesions in the spine compatible with metastatic neuroendocrine tumor. Most are under 15 mm. There is an 18 mm lesion along the right S1 neural foramen, and a 24 mm left S2 body of lesion, but no definite epidural or extraosseous extension. No destructive osseous lesion identified. Superimposed lumbar spine degeneration including disc, endplate and posterior element degeneration. IMPRESSION: 1. Linear, partially metal density foreign body in the anus (sagittal image 69). 2. 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 No acute complication is identified. 3. No inflammatory process in the abdomen or pelvis. 4.  Calcified aortic atherosclerosis. 5. Mild lung base atelectasis. Electronically Signed   By: Genevie Ann M.D.   On:  11/12/2016 18:50   Dg Chest Portable 1 View  Result Date: 11/12/2016 CLINICAL DATA:  Generalized abdominal pain, bradycardia, hypothermia EXAM: PORTABLE CHEST 1 VIEW COMPARISON:  11/02/2015 FINDINGS: Cardiomediastinal silhouette is stable. No infiltrate or pleural effusion. No pulmonary edema. Bony thorax is unremarkable. IMPRESSION: No active disease. Electronically Signed   By: Lahoma Crocker M.D.   On: 11/12/2016 16:27    ____________________________________________   PROCEDURES Procedures CRITICAL CARE Performed by: Joni Fears, Isma Tietje   Total critical care time: 35 minutes  Critical care time was exclusive of separately billable procedures and treating other patients.  Critical care was necessary to treat or prevent imminent or life-threatening deterioration.  Critical care was time spent personally by me on the following activities: development of treatment plan with patient and/or surrogate as well as nursing, discussions with consultants, evaluation of patient's response to treatment, examination of patient, obtaining history from patient or surrogate, ordering and performing treatments and interventions, ordering  and review of laboratory studies, ordering and review of radiographic studies, pulse oximetry and re-evaluation of patient's condition.  ____________________________________________   INITIAL IMPRESSION / ASSESSMENT AND PLAN / ED COURSE  Pertinent labs & imaging results that were available during my care of the patient were reviewed by me and considered in my medical decision making (see chart for details).  Patient presents with generalized weakness, hypothermia, bradycardia. Concern for intra-abdominal pathology. Getting labs, chest x-ray, CT scan. Warm fluids. Retail banker.  . Empiric vancomycin and Zosyn and septic workup. Patient will require hospitalization for further evaluation and management once initial workup is completed.     Clinical Course as of Nov 13 1947  Sat Nov 12, 2016  1626 Bedside POCUS negative for pericardial effusion, AAA, or abd free fluid.   [PS]  1940 Workup entirely negative. Vital signs improved, blood pressure remained stable. Heart rate 55. Patient feels better. Given that the patient showed a bradycardic hypothermic and with syncope, I recommended admission to the hospital for further evaluation overnight. Patient refuses. He does have medical decision-making capacity. Return precautions discussed, patient agrees to return if he has any worsening of his condition or new or recurrent symptoms. Otherwise He'll follow up with his primary care [facility] on Monday in 2 days.  [PS]  1949 Per clnic note, baseline temp ~97 degrees.  Baseline HR about 50. Pt appears back to baseline after hydration.   [PS]    Clinical Course User Index [PS] Carrie Mew, MD   Patient be discharged in the care of 3 family members who are complaining him at bedside including sister and girlfriend..  ____________________________________________   FINAL CLINICAL IMPRESSION(S) / ED DIAGNOSES  Final diagnoses:  Syncope, unspecified syncope type  Dehydration  Generalized abdominal pain      New Prescriptions   No medications on file     Portions of this note were generated with dragon dictation software. Dictation errors may occur despite best attempts at proofreading.    Carrie Mew, MD 11/12/16 [place], MD 11/12/16 1949

## 2016-11-12 NOTE — ED Notes (Signed)
Patient transported to CT 

## 2016-11-12 NOTE — ED Notes (Signed)
Patient transported to MRI 

## 2016-11-12 NOTE — ED Notes (Signed)
Pt has returned to room and is requesting anxiety medication and food.MD made aware of anxiety.

## 2016-11-14 LAB — URINE CULTURE: CULTURE: NO GROWTH

## 2016-11-17 LAB — CULTURE, BLOOD (ROUTINE X 2)
CULTURE: NO GROWTH
Culture: NO GROWTH
SPECIAL REQUESTS: ADEQUATE
SPECIAL REQUESTS: ADEQUATE

## 2017-03-31 ENCOUNTER — Encounter: Payer: Self-pay | Admitting: Emergency Medicine

## 2017-03-31 ENCOUNTER — Emergency Department: Payer: Medicaid Other

## 2017-03-31 ENCOUNTER — Emergency Department
Admission: EM | Admit: 2017-03-31 | Discharge: 2017-03-31 | Disposition: A | Discharge status: Home / Self Care | Payer: Medicaid Other | Attending: Emergency Medicine | Admitting: Emergency Medicine

## 2017-03-31 DIAGNOSIS — I110 Hypertensive heart disease with heart failure: Secondary | ICD-10-CM | POA: Insufficient documentation

## 2017-03-31 DIAGNOSIS — Z79899 Other long term (current) drug therapy: Secondary | ICD-10-CM | POA: Insufficient documentation

## 2017-03-31 DIAGNOSIS — F17210 Nicotine dependence, cigarettes, uncomplicated: Secondary | ICD-10-CM | POA: Insufficient documentation

## 2017-03-31 DIAGNOSIS — I2510 Atherosclerotic heart disease of native coronary artery without angina pectoris: Secondary | ICD-10-CM | POA: Diagnosis not present

## 2017-03-31 DIAGNOSIS — I10 Essential (primary) hypertension: Secondary | ICD-10-CM | POA: Insufficient documentation

## 2017-03-31 DIAGNOSIS — I509 Heart failure, unspecified: Secondary | ICD-10-CM | POA: Insufficient documentation

## 2017-03-31 DIAGNOSIS — R4182 Altered mental status, unspecified: Secondary | ICD-10-CM | POA: Insufficient documentation

## 2017-03-31 DIAGNOSIS — Z7982 Long term (current) use of aspirin: Secondary | ICD-10-CM | POA: Insufficient documentation

## 2017-03-31 DIAGNOSIS — R531 Weakness: Secondary | ICD-10-CM | POA: Diagnosis present

## 2017-03-31 DIAGNOSIS — Z5320 Procedure and treatment not carried out because of patient's decision for unspecified reasons: Secondary | ICD-10-CM | POA: Diagnosis not present

## 2017-03-31 DIAGNOSIS — Z7902 Long term (current) use of antithrombotics/antiplatelets: Secondary | ICD-10-CM | POA: Diagnosis not present

## 2017-03-31 DIAGNOSIS — Z8673 Personal history of transient ischemic attack (TIA), and cerebral infarction without residual deficits: Secondary | ICD-10-CM | POA: Diagnosis not present

## 2017-03-31 LAB — COMPREHENSIVE METABOLIC PANEL
ALT: 28 U/L (ref 17–63)
AST: 33 U/L (ref 15–41)
Albumin: 4.8 g/dL (ref 3.5–5.0)
Alkaline Phosphatase: 60 U/L (ref 38–126)
Anion gap: 12 (ref 5–15)
BUN: 12 mg/dL (ref 6–20)
CHLORIDE: 104 mmol/L (ref 101–111)
CO2: 22 mmol/L (ref 22–32)
Calcium: 9.6 mg/dL (ref 8.9–10.3)
Creatinine, Ser: 0.64 mg/dL (ref 0.61–1.24)
Glucose, Bld: 132 mg/dL — ABNORMAL HIGH (ref 65–99)
POTASSIUM: 3.5 mmol/L (ref 3.5–5.1)
SODIUM: 138 mmol/L (ref 135–145)
Total Bilirubin: 0.9 mg/dL (ref 0.3–1.2)
Total Protein: 7.7 g/dL (ref 6.5–8.1)

## 2017-03-31 LAB — CBC WITH DIFFERENTIAL/PLATELET
BASOS ABS: 0 10*3/uL (ref 0–0.1)
Basophils Relative: 0 %
EOS PCT: 0 %
Eosinophils Absolute: 0 10*3/uL (ref 0–0.7)
HEMATOCRIT: 44.8 % (ref 40.0–52.0)
HEMOGLOBIN: 15.5 g/dL (ref 13.0–18.0)
LYMPHS ABS: 2 10*3/uL (ref 1.0–3.6)
LYMPHS PCT: 22 %
MCH: 32.8 pg (ref 26.0–34.0)
MCHC: 34.5 g/dL (ref 32.0–36.0)
MCV: 95 fL (ref 80.0–100.0)
Monocytes Absolute: 0.9 10*3/uL (ref 0.2–1.0)
Monocytes Relative: 10 %
NEUTROS ABS: 6.1 10*3/uL (ref 1.4–6.5)
NEUTROS PCT: 68 %
PLATELETS: 182 10*3/uL (ref 150–440)
RBC: 4.72 MIL/uL (ref 4.40–5.90)
RDW: 13.4 % (ref 11.5–14.5)
WBC: 9 10*3/uL (ref 3.8–10.6)

## 2017-03-31 LAB — LIPASE, BLOOD: LIPASE: 25 U/L (ref 11–51)

## 2017-03-31 LAB — ACETAMINOPHEN LEVEL: Acetaminophen (Tylenol), Serum: <10 ug/mL — ABNORMAL LOW (ref 10–30)

## 2017-03-31 LAB — SALICYLATE LEVEL

## 2017-03-31 LAB — LACTIC ACID, PLASMA: LACTIC ACID, VENOUS: 1.4 mmol/L (ref 0.5–1.9)

## 2017-03-31 LAB — TROPONIN I: Troponin I: 0.03 ng/mL (ref <0.03)

## 2017-03-31 LAB — GLUCOSE, CAPILLARY: GLUCOSE-CAPILLARY: 142 mg/dL — AB (ref 65–99)

## 2017-03-31 LAB — AMMONIA

## 2017-03-31 LAB — MAGNESIUM: Magnesium: 1.9 mg/dL (ref 1.7–2.4)

## 2017-03-31 MED ORDER — NALOXONE HCL 2 MG/2ML IJ SOSY
PREFILLED_SYRINGE | INTRAMUSCULAR | Status: AC
  Administered 2017-03-31: 0.1 mg
  Filled 2017-03-31: qty 2

## 2017-03-31 MED ORDER — SODIUM CHLORIDE 0.9 % IV BOLUS (SEPSIS)
1000.0000 mL | INTRAVENOUS | Status: AC
  Administered 2017-03-31: 1000 mL via INTRAVENOUS

## 2017-03-31 MED ORDER — NALOXONE HCL 0.4 MG/ML IJ SOLN: 0.1000 mg | INTRAMUSCULAR | Status: DC

## 2017-03-31 NOTE — ED Notes (Signed)
Pt has decided to leave AMA, has declined additional blood work at this time, and has declined a transfer to [facility] for additional testing.  Family present at this time and all members verbalize understanding of information presented.

## 2017-03-31 NOTE — Discharge Instructions (Signed)
In spite of extensive laboratory and radiographic testing, I was unable to identify a specific cause for your symptoms today.  Your lab results did not point to a specific diagnosis, and your head CT showed no acute abnormalities.  You have the capacity to make your own medical decisions, and you chose to leave rather than stay for repeat lab testing here or for transfer to [place] where the majority of your doctors are located.  We recommend that you follow up with your regular doctors at the next available opportunity to discuss how you are feeling, your episodes today, and your medications and the possibility that medication side effects may be causing your symptoms.  Please return immediately to the nearest [facility] Department if you develop any new, recurrent, or worsening symptoms.

## 2017-03-31 NOTE — ED Notes (Signed)
Patient states to this RN "I took 50 vicodin".  MD made aware that patient stated this along with his statement of "I want narcan!".  MD informed of all this information along with patient's family.

## 2017-03-31 NOTE — ED Notes (Signed)
Pt is uncooperative, agitated, and unable to follow commands at this time. MD made aware. This RN attempted to redirect patient many times with no success. Pt continuing to verbally threaten RNs and continues to yell at all staff.

## 2017-03-31 NOTE — ED Provider Notes (Signed)
[facility] Emergency Department Provider Note  ____________________________________________   First MD Initiated Contact with Patient 03/31/17 1159     (approximate)  I have reviewed the triage vital signs and the nursing notes.   HISTORY  Chief Complaint Weakness  Level 5 caveat:  history/ROS limited by acute/critical illness  HPI Trevor Rose is a 56 y.o. 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 He reports that he has been increasingly weak and not feeling well for at least 4 days and has not been eating or drinking. The symptoms of been steadily getting worse over this period of time and are now severe.  he is exhibiting some bizarre behavior, staring around the room, refusing to speak for minutes at a time and then yelling at nurses and myself, yelling loudly and clearly that he cannot breathe or get any oxygen (spO2 was 96-98% with good wave form), stating that he cannot speak and that he is paralyzed while speaking and moving all of his limbs, etc.  He is clearly in distress but he is unwilling and unable to provide any additional history or review of systems.   Past Medical History:  Diagnosis Date  . Anginal pain ([place])   . Anxiety   . Cancer ([place])    liver  . CHF (congestive heart failure) ([place])   . Coronary artery disease   . Depression   . Hypertension   . MI (myocardial infarction) ([place])   . Shortness of breath dyspnea   . Stroke [facility])     There are no active problems to display for this patient.   Past Surgical History:  Procedure Laterality Date  . CORONARY ANGIOPLASTY WITH STENT PLACEMENT      Prior to Admission medications   Medication Sig Start Date End Date Taking? Authorizing Provider  ALPRAZolam Duanne Moron) 1 MG tablet Take 1 mg by mouth 3 (three) times daily.    Yes [provider]  aspirin EC 81 MG tablet Take 81 mg by mouth daily.   Yes  [provider]  atorvastatin (LIPITOR) 20 MG tablet Take 20 mg by mouth daily.   Yes [provider]  budesonide-formoterol (SYMBICORT) 80-4.5 MCG/ACT inhaler Inhale 2 puffs into the lungs 2 (two) times daily.   Yes [provider]  clopidogrel (PLAVIX) 75 MG tablet Take 1 tablet by mouth daily.   Yes [provider]  FLUoxetine (PROZAC) 40 MG capsule Take 40 mg by mouth daily.   Yes [provider]  gabapentin (NEURONTIN) 300 MG capsule Take 300 mg by mouth 4 (four) times daily.    Yes [provider]  HYDROcodone-acetaminophen (NORCO) 10-325 MG tablet Take 2 tablets by mouth every 8 (eight) hours as needed for moderate pain.    Yes [provider]  ipratropium-albuterol (DUONEB) 0.5-2.5 (3) MG/3ML SOLN Inhale 3 mLs into the lungs 4 (four) times daily as needed.   Yes [provider]  lisinopril (PRINIVIL,ZESTRIL) 5 MG tablet Take 1 tablet by mouth daily.   Yes [provider]  predniSONE (DELTASONE) 10 MG tablet Take a daily regimen of 6,5,4,3,2,1 Patient not taking: Reported on 11/12/2016 11/02/15   Hagler, Jami L, PA-C    Allergies Patient has no known allergies.  No family history on file.  Social History Social History  Substance Use Topics  . Smoking status: Current Every Day Smoker    Packs/day: 1.00    Types: Cigarettes  .  Smokeless tobacco: Never Used  . Alcohol use No    Review of Systems Level 5 caveat:  history/ROS limited by acute/critical illness ____________________________________________   PHYSICAL EXAM:  VITAL SIGNS: ED Triage Vitals  Enc Vitals Group     BP 03/31/17 1130 (!) 162/103     Pulse Rate 03/31/17 1130 86     Resp 03/31/17 1130 14     Temp 03/31/17 1144 98.2 F (36.8 C)     Temp Source 03/31/17 1144 Oral     SpO2 03/31/17 1130 99 %     Weight 03/31/17 1130 79.8 kg (176 lb)     Height 03/31/17 1130 1.829 m (6')     Head Circumference --      Peak Flow --       Pain Score 03/31/17 1130 10     Pain Loc --      Pain Edu? --      Excl. in [place]? --     Constitutional: the patient is in acute istress but it is unclear of what sort.  Unusual and somewhat aggressive behavior, uncooperative, but unclear whether this is voluntary Eyes: Conjunctivae are normal. PERRL. EOMI. Head: Atraumatic. Nose: No congestion/rhinnorhea. Mouth/Throat: Mucous membranes are dry Neck: No stridor.  No meningeal signs.   Cardiovascular: Normal rate, regular rhythm. Good peripheral circulation. Grossly normal heart sounds. Respiratory: Normal respiratory effort.  No retractions. Lungs CTAB. Gastrointestinal: Soft and nondistended with mild diffuse tenderness throughout the abdomen but no focal tenderness Musculoskeletal: No lower extremity tenderness nor edema. No gross deformities of extremities. Neurologic:  Forced speech and language but no obvious slurring.  Moving all four extremities but claims he is paralyzed.  he had an episode of seizure-like activity of his right arm while I was in the room but he responded to noxious stimuli by stopping the shaking and then again resumed. I raised his arm over his face and let it drop and he moved it to the side and related down gently on the bed before resuming the shaking Skin:  Skin is warm, dry and intact. No rash noted. Psychiatric: Mood and affect labile.  The patient is apparently unwilling or unable to cooperate, intermittently seems to be unresponsive but will yell at nurses and myself from time to time.    ____________________________________________   LABS (all labs ordered are listed, but only abnormal results are displayed)  Labs Reviewed  COMPREHENSIVE METABOLIC PANEL - Abnormal; Notable for the following:       Result Value   Glucose, Bld 132 (*)    All other components within normal limits  TROPONIN I - Abnormal; Notable for the following:    Troponin I 0.03 (*)    All other components within normal limits    AMMONIA - Abnormal; Notable for the following:    Ammonia <9 (*)    All other components within normal limits  GLUCOSE, CAPILLARY - Abnormal; Notable for the following:    Glucose-Capillary 142 (*)    All other components within normal limits  ACETAMINOPHEN LEVEL - Abnormal; Notable for the following:    Acetaminophen (Tylenol), Serum <10 (*)    All other components within normal limits  LIPASE, BLOOD  CBC WITH DIFFERENTIAL/PLATELET  MAGNESIUM  LACTIC ACID, PLASMA  SALICYLATE LEVEL  URINALYSIS, COMPLETE (UACMP) WITH MICROSCOPIC  CBG MONITORING, ED   ____________________________________________  EKG  ED ECG REPORT I, Pascha Fogal, the attending physician, personally viewed and interpreted this ECG.  Date: 03/31/2017 EKG Time: 11:29 Rate:  82 Rhythm: sinus rhythm QRS Axis: normal Intervals: normal ST/T Wave abnormalities: Non-specific ST segment / T-wave changes, but no evidence of acute ischemia. Narrative Interpretation: unremarkable  ____________________________________________  RADIOLOGY   Ct Head Wo Contrast  Result Date: 03/31/2017 CLINICAL DATA:  Weakness. EXAM: CT HEAD WITHOUT CONTRAST TECHNIQUE: Contiguous axial images were obtained from the base of the skull through the vertex without intravenous contrast. COMPARISON:  MRI brain dated November 20, 2015. FINDINGS: Brain: No evidence of acute infarction, hemorrhage, hydrocephalus, extra-axial collection or mass lesion/mass effect. Unchanged bilateral thalamic lacunar infarcts. Vascular: No hyperdense vessel or unexpected calcification. Skull: Normal. Negative for fracture or focal lesion. Sinuses/Orbits: The bilateral paranasal sinuses and mastoid air cells are clear. The orbits are unremarkable. Other: None. IMPRESSION: 1.  No acute intracranial abnormality. Electronically Signed   By: Titus Dubin M.D.   On: 03/31/2017 13:50    ____________________________________________   PROCEDURES  Critical Care  performed: Yes, see critical care procedure note(s)   Procedure(s) performed:   .Critical Care Performed by: Hinda Kehr Authorized by: Hinda Kehr   Critical care provider statement:    Critical care time (minutes):  40   Critical care time was exclusive of:  Separately billable procedures and treating other patients   Critical care was necessary to treat or prevent imminent or life-threatening deterioration of the following conditions:  CNS failure or compromise and metabolic crisis (psychiatric)   Critical care was time spent personally by me on the following activities:  Development of treatment plan with patient or surrogate, discussions with consultants, evaluation of patient's response to treatment, examination of patient, obtaining history from patient or surrogate, ordering and performing treatments and interventions, ordering and review of laboratory studies, ordering and review of radiographic studies, pulse oximetry, re-evaluation of patient's condition and review of old charts      ____________________________________________   INITIAL IMPRESSION / [place] / ED COURSE  Pertinent labs & imaging results that were available during my care of the patient were reviewed by me and considered in my medical decision making (see chart for details).  initiating a broad workup, the patient has known metastatic cancerbut his current presentation is very abnormal compared to his baseline. I am checking for acetaminophen, salicylate, ammonia, lactic acid, standard labs, and we will obtain a CT scan of his head but I will likely need to provide a calming agent before we can obtain any advanced imaging.   Clinical Course as of Apr 01 1519  Fri Mar 31, 2017  1230 I spoke with the patient's family in the hallway  As I was speaking to them about his behavior and constellation of symptoms he began yelling at the nurses again.  He is demanding Narcan and claims that he took 50  Vicodin today.  The family is attempting to verify this by having somewhat a home check the medicine bottles, and he is not presenting as one would expect for a narcotics overdose, but if it is true, a small dose of Narcan may be helpful..  I will give Narcan 0.1 mg IV given that he is on chronic opioids.  The family does confirm that he has "issues" with taking too much pain medication so it may show an improvement in his symptoms.  I just received additional information that the patient actually walked to the ambulance on his own, and only after arriving in the ED did he start the bizarre behavior, shaking of his right arm, and intermittent episodes of being non-communicative.  [  CF]  1303 Normal acetaminophen.  Will schedule four hour redraw, but I doubt the patient took 50 Vicodin as reported given the completely negative initial level. Acetaminophen (Tylenol), S: (!) <10 [CF]  1354 Very slightly elevated troponin at 0.03 of unknown significance.  Head CT unremarkable.  Pain now calm and behavior is more normal than before.    [CF]  1436 I spent an extended period of time in the room with the patient and his family.  His overall clinical picture has improved significantly - he is speaking clearly, moving all his extremities, etc.  He reports that he lied earlier about taking 50 Vicodin so that we would give him the Narcan because he knows that is what he needed to "cleanse" his system.  I explained that Narcan only works on opioids, but he refuses to believe that.  Even though clinically he has improved, he remains verbally argumentative both with ED staff, myself, and his family.  Daughter and wife report that although this is "more like his usual self", he is still not acting normal.  I did my best to explain that I do not have a single test that will give me all the answers or explanations for his behavior or why he was feeling the way he was feeling, and they are understandably frustrated by my inability  to provide a single solid explanation.  His daughter brought up the issue of anxiety and panic attacks, and I strongly supposed this and psychiatric issues in general as the most likely possiblity, but again explained that I do not have a way to test for this specifically.    After an extensive conversation, I strongly recommended (almost begged) for him to let me repeat, at a minimum, a troponin and an acetaminophen level, but he adamantly refuses.  I even brought up to him and his family the possibility of involuntary commitment to compel him to stay, but I do not feel he meets IVC criteria nor inpatient psychiatric criteria.  The family is non-commital about this option but also do not seem willing to take this drastic step.  [CF]  7989 I finished the patient's discharge paperwork and went back to the room.  He is ambulatory without difficulty.  I again asked him to stay for repeat lab work, and he again declines.  He and his family are aware he is leaving against my advice, he signed the AMA form, and I explained the AMA status in his written d/c instructions as well.  I strongly encouraged them to follow up ASAP with his regular providers and to return immediately to the nearest ED should he develop any new, recurrent, or worsening symptoms.  [CF]    Clinical Course User Index [CF] Hinda Kehr, MD    ____________________________________________  FINAL CLINICAL IMPRESSION(S) / ED DIAGNOSES  Final diagnoses:  Altered mental status, unspecified altered mental status type     MEDICATIONS GIVEN DURING THIS VISIT:  Medications  naloxone (NARCAN) injection 0.1 mg (0.1 mg Intravenous See Procedure Record 03/31/17 1251)  naloxone [facility]) 2 MG/2ML injection (0.1 mg  Given 03/31/17 1251)  sodium chloride 0.9 % bolus 1,000 mL (0 mLs Intravenous Stopped 03/31/17 1411)     NEW OUTPATIENT MEDICATIONS STARTED DURING THIS VISIT:  Discharge Medication List as of 03/31/2017  2:46 PM      Discharge  Medication List as of 03/31/2017  2:46 PM      Discharge Medication List as of 03/31/2017  2:46 PM  Note:  This document was prepared using Dragon voice recognition software and may include unintentional dictation errors.    Hinda Kehr, MD 03/31/17 1520

## 2017-03-31 NOTE — ED Notes (Signed)
ED Provider at bedside. 

## 2017-03-31 NOTE — ED Triage Notes (Signed)
Pt comes into the ED via [facility] from home c/o overall weakness and "just not feeling well". Patient states he has not been eating for 4 days and has increasing weakness.  New dose of gabapentin was started a week ago and since then he has been having the weakness.  Patient has h/o liver cancer.  Patient refuses to open eyes with this RN at this time and states he has had difficulty seeing as well as chest discomfort.  Patient is alert and oriented x4 and has even and unlabored respirations at this time.

## 2017-03-31 NOTE — ED Notes (Signed)
Patient was asked again about the vicodin use he had.  Patient states "I didn't take that many vicodin, I just said that to get her to give me Narcan".

## 2017-05-21 DIAGNOSIS — F309 Manic episode, unspecified: Secondary | ICD-10-CM | POA: Insufficient documentation

## 2017-05-26 DIAGNOSIS — E440 Moderate protein-calorie malnutrition: Secondary | ICD-10-CM | POA: Insufficient documentation

## 2017-12-02 IMAGING — CT CT HEAD W/O CM
3 series · 15 of 47 positions shown, 18 images · non-contrast
Comparison: MRI brain dated November 20, 2015.

CLINICAL DATA: Weakness.

EXAM:
CT HEAD WITHOUT CONTRAST
TECHNIQUE: Contiguous axial images were obtained from the base of the skull
through the vertex without intravenous contrast.

[Series 3: head wo · axial · 0.44mm/px · z∈[-106,+24]mm · 9 of 32 slices shown, 12 images]
[im 3/32  brain]
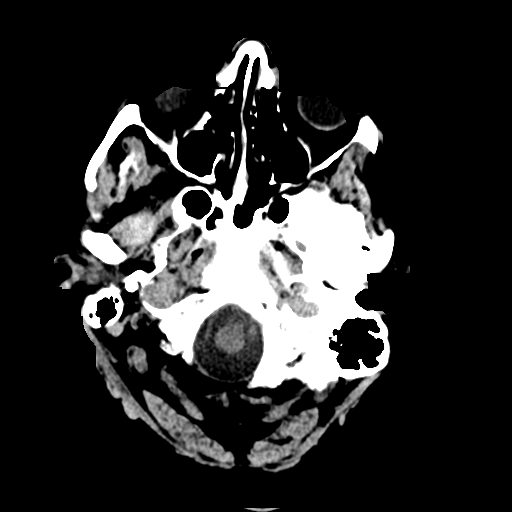
[im 3/32  bone]
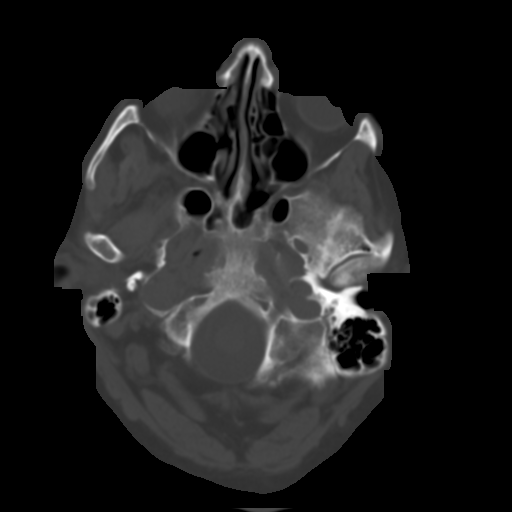
[im 6/32  brain]
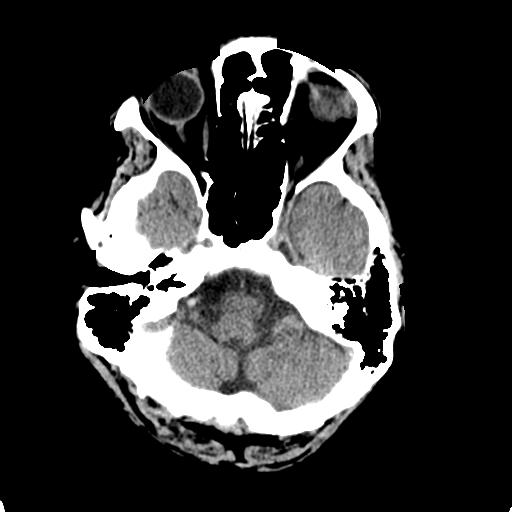
[im 9/32  brain]
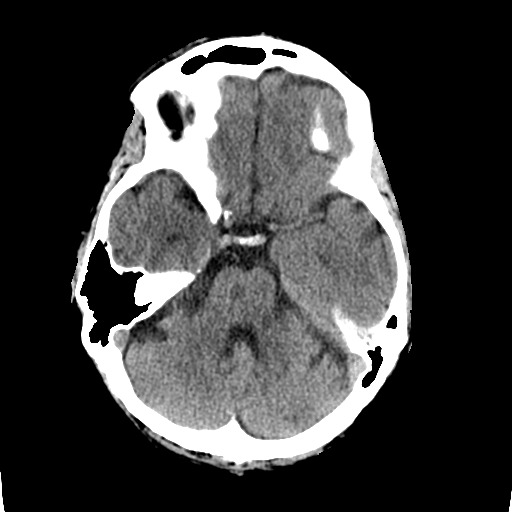
[im 12/32  brain]
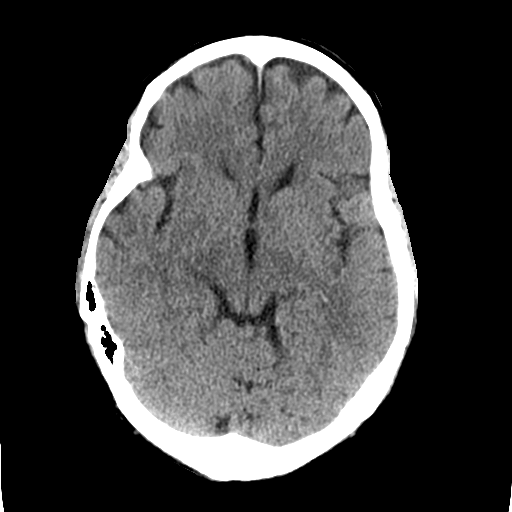
[im 17/32  brain]
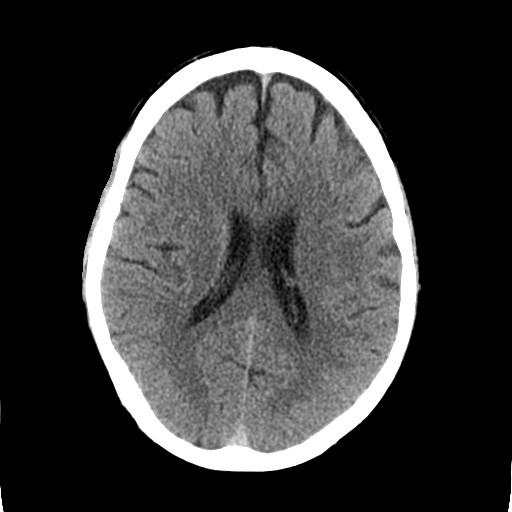
[im 17/32  bone]
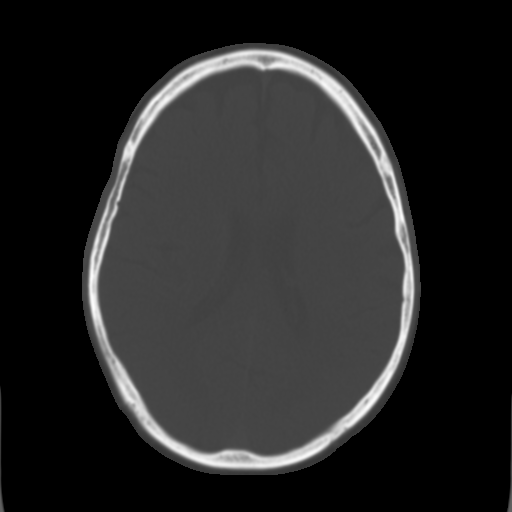
[im 20/32  brain]
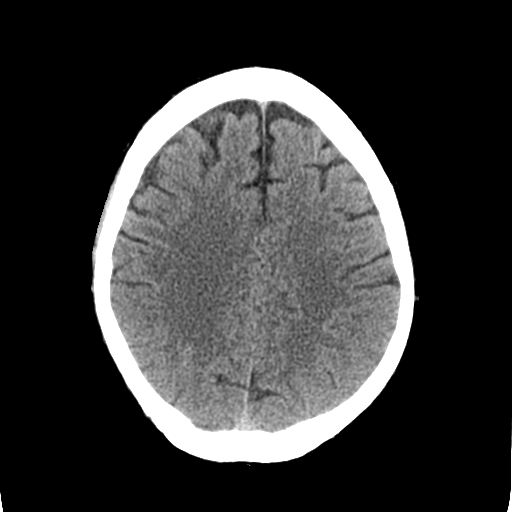
[im 23/32  brain]
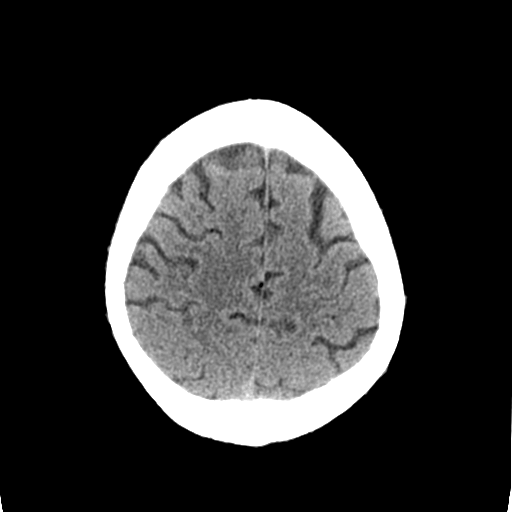
[im 26/32  brain]
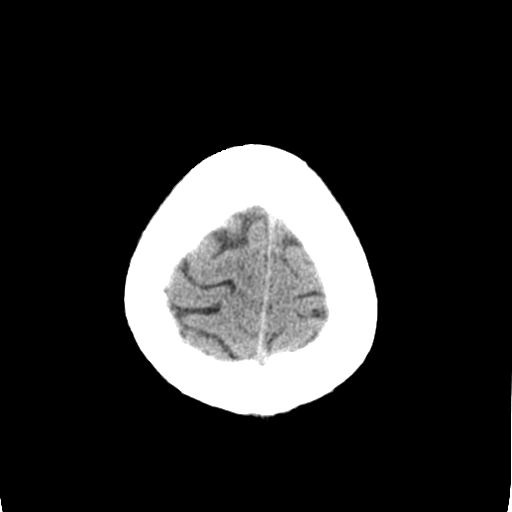
[im 29/32  brain]
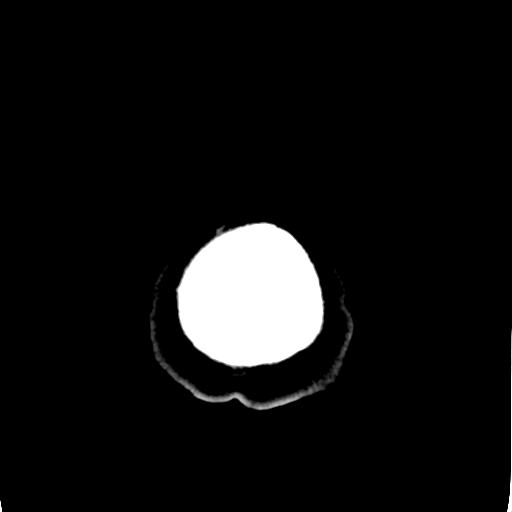
[im 29/32  bone]
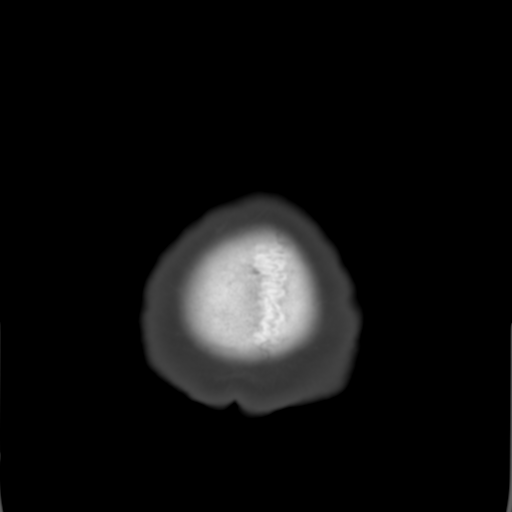

[Series 4: coronal soft tissue · coronal · 0.32mm/px · 3 of 67 slices shown]
[im 23/67  brain]
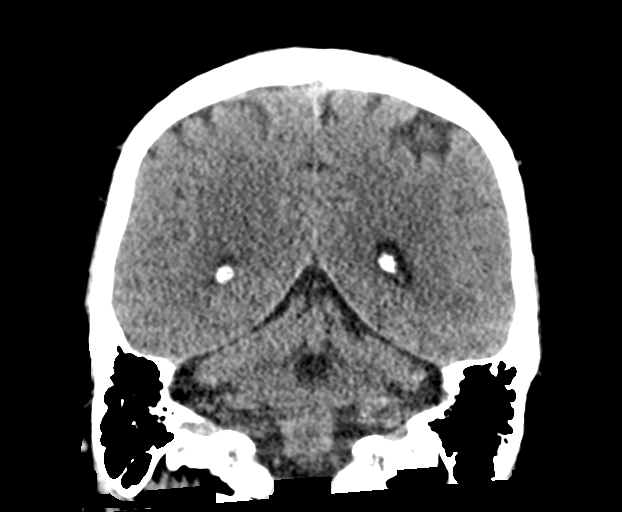
[im 30/67  brain]
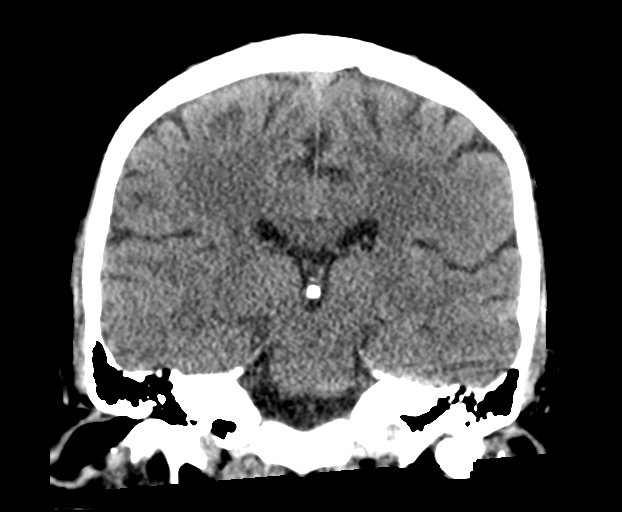
[im 37/67  brain]
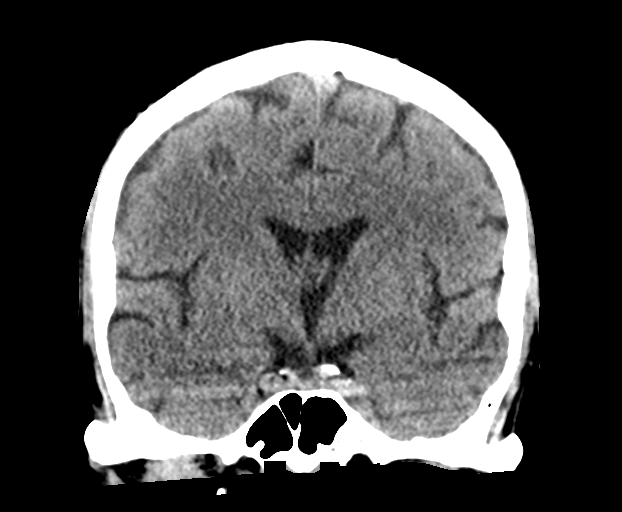

[Series 5: sagittal soft tissue · sagittal · 0.33mm/px · 3 of 53 slices shown]
[im 18/53  brain]
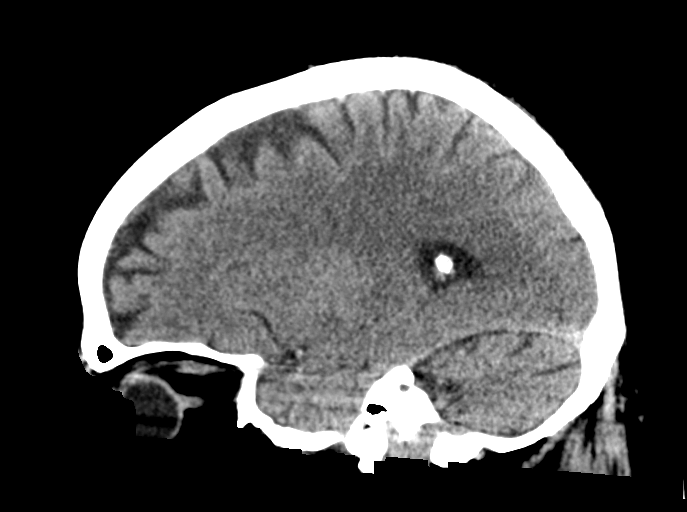
[im 27/53  brain]
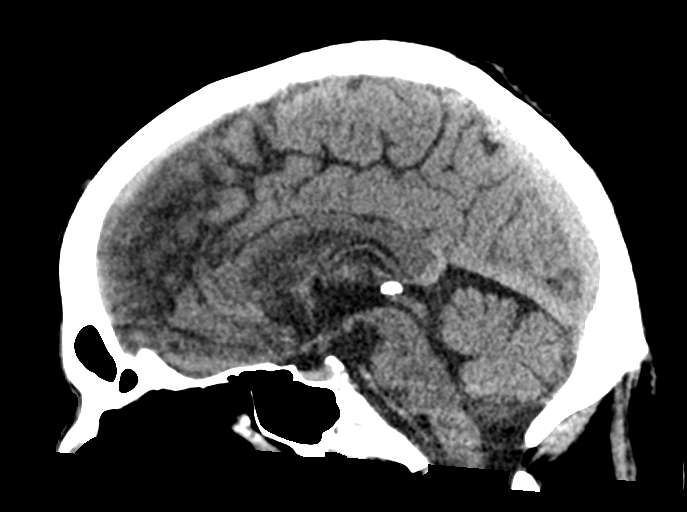
[im 35/53  brain]
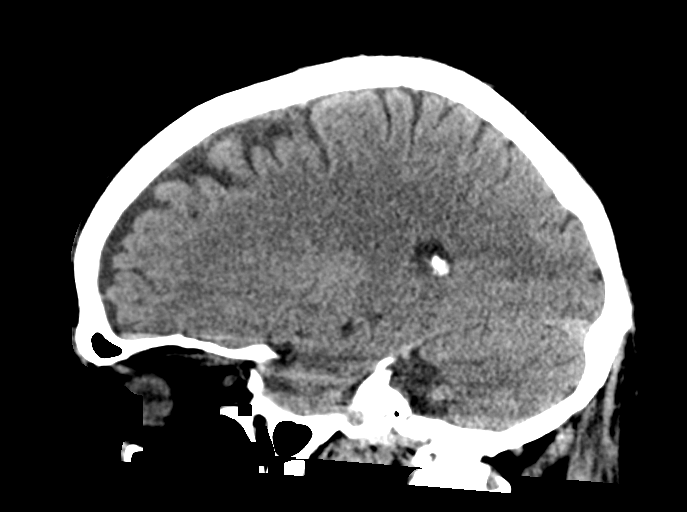

[15 of 47 positions shown; findings below may reference images not displayed]

FINDINGS: Brain: No evidence of acute infarction, hemorrhage, hydrocephalus,
extra-axial collection or mass lesion/mass effect. Unchanged
bilateral thalamic lacunar infarcts.

Vascular: No hyperdense vessel or unexpected calcification.

Skull: Normal. Negative for fracture or focal lesion.

Sinuses/Orbits: The bilateral paranasal sinuses and mastoid air
cells are clear. The orbits are unremarkable.

Other: None.
IMPRESSION: 1.  No acute intracranial abnormality.

## 2018-07-02 ENCOUNTER — Ambulatory Visit (INDEPENDENT_AMBULATORY_CARE_PROVIDER_SITE_OTHER): Payer: Medicaid Other | Admitting: Psychiatry

## 2018-07-02 ENCOUNTER — Other Ambulatory Visit: Payer: Self-pay

## 2018-07-02 ENCOUNTER — Encounter: Payer: Self-pay | Admitting: Psychiatry

## 2018-07-02 VITALS — BP 131/83 | HR 78 | Temp 97.8°F | Wt 186.8 lb

## 2018-07-02 DIAGNOSIS — F5105 Insomnia due to other mental disorder: Secondary | ICD-10-CM | POA: Diagnosis not present

## 2018-07-02 DIAGNOSIS — F17200 Nicotine dependence, unspecified, uncomplicated: Secondary | ICD-10-CM

## 2018-07-02 DIAGNOSIS — F330 Major depressive disorder, recurrent, mild: Secondary | ICD-10-CM

## 2018-07-02 DIAGNOSIS — F1211 Cannabis abuse, in remission: Secondary | ICD-10-CM

## 2018-07-02 DIAGNOSIS — F411 Generalized anxiety disorder: Secondary | ICD-10-CM | POA: Diagnosis not present

## 2018-07-02 DIAGNOSIS — F1011 Alcohol abuse, in remission: Secondary | ICD-10-CM

## 2018-07-02 DIAGNOSIS — M1990 Unspecified osteoarthritis, unspecified site: Secondary | ICD-10-CM | POA: Insufficient documentation

## 2018-07-02 DIAGNOSIS — F1411 Cocaine abuse, in remission: Secondary | ICD-10-CM

## 2018-07-02 MED ORDER — MIRTAZAPINE 7.5 MG PO TABS: 7.5000 mg | ORAL_TABLET | Freq: Every day | ORAL | 1 refills | Status: DC

## 2018-07-02 NOTE — Progress Notes (Signed)
Psychiatric Initial Adult Assessment   Patient Identification: Trevor Rose MRN:  [id number] Date of Evaluation:  07/02/2018 Referral Source: Evern Bio NP Chief Complaint: ' I am here to establish care."  Chief Complaint    Establish Care; Anxiety; Fatigue; Depression; Stress     Visit Diagnosis:    ICD-10-CM   1. MDD (major depressive disorder), recurrent episode, mild (HCC) F33.0 mirtazapine (REMERON) 7.5 MG tablet  2. Insomnia due to mental condition F51.05 mirtazapine (REMERON) 7.5 MG tablet  3. GAD (generalized anxiety disorder) F41.1 mirtazapine (REMERON) 7.5 MG tablet  4. Tobacco use disorder F17.200   5. Cocaine use disorder, mild, in sustained remission (HCC) F14.11   6. Cannabis use disorder, mild, in sustained remission F12.11   7. Alcohol use disorder, mild, in sustained remission F10.11     History of Present Illness:  [name] is a 57 year old Caucasian male, divorced, on SSI, lives in [place], has a history of anxiety and depressive symptoms, sleep problems, CAD, hypertension, COPD, TIA, stage IV neuroendocrine cancer with metastasis presented to the clinic today to establish care.  Patient reports he has been struggling with mood symptoms which are worsening since the past few months.  Patient describes his symptoms as depression, lack of motivation, anhedonia, lack of energy, sleep problems, lack of appetite and so on.  Patient reports he also struggles with anxiety symptoms.  He describes his anxiety symptoms as feeling nervous, anxious, on edge, inability to stop worrying, worrying about different things, trouble relaxing, being restless, becoming easily annoyed and irritable and so on.  Patient reports this has been ongoing since the past several years and getting worse since the past few months.  Patient reports he had an admission at [facility] September 2018 for worsening bizarre behavior and being increasingly paranoid.  Patient at that time per review of records had  changed the locks on his house and was paranoid that his doctors were giving toxic medications.  Patient , per review of records at that time was also on a very high dose of hydromorphone as well as was taking Xanax.  Patient was seen by psychiatrist at [facility] during his inpatient stay and was started on medications like Haldol and Depakote for possible late onset manic symptoms.  However it was unclear whether his bizarre behavior was due to his polypharmacy, being on high doses of opioid medications or due to his neuroendocrine tumor.  Patient reports that he stopped taking his Depakote as well as Haldol since he was worried about the side effects.  Patient reports after his discharge from [facility] in September 2018,  he did not have any similar symptoms again.  Patient was asked to fill out a mood disorder questionnaire today and he scored very low on the same.  He denies any manic or hypomanic symptoms.  Patient reports he currently takes Klonopin which was prescribed to him few months ago.  He is currently on 0.5 mg twice a day.  He describes that he takes this medication for anxiety symptoms.  He however continues to struggle with anxiety and depression as noted above.  Patient denies any suicidality or homicidality.  Some of patient's symptoms like lethargy, fatigue and appetite changes are also due to his cancer diagnosis and treatment.  Patient reports his oncologist had given him Marinol previously for his appetite changes however he stopped taking it because it does not help much.  Patient reports the smell of food makes him nauseous often.  Patient reports he lives with  his mother and takes care of her.  He appears to be cognitively alert and oriented.  He reports he is independent and is able to take care of himself, his finances, and he cooks for his mother.  Patient has social support from his daughters.   Associated Signs/Symptoms: Depression Symptoms:  depressed  mood, anhedonia, insomnia, psychomotor retardation, fatigue, anxiety, decreased appetite, (Hypo) Manic Symptoms:  denies Anxiety Symptoms:  Excessive Worry, Social Anxiety, Psychotic Symptoms:  denies PTSD Symptoms: Negative  Past Psychiatric History: Patient per review of records had an inpatient admission at [facility] for bizarre behavior in September 2018.  Patient however at that time had several possible reasons for his behavior and paranoia including polypharmacy, opioid dependence, benzodiazepine dependence, neuroendocrine tumor and so on.  Patient was started on medications like Depakote and Haldol during his stay however he was not compliant with the medication.  Patient denies any suicide attempts.  Previous Psychotropic Medications: Yes -Depakote, Haldol, Klonopin, Xanax  Substance Abuse History in the last 12 months:  No.  Review of records patient does have a history of cocaine, cannabis and alcohol abuse several years ago.  Patient however reports he is in remission now and has not used them in years.  Consequences of Substance Abuse: Negative  Past Medical History:  Past Medical History:  Diagnosis Date  . Anginal pain ([place])   . Anxiety   . Cancer ([place])    liver  . CHF (congestive heart failure) ([place])   . Coronary artery disease   . Depression   . Hypertension   . MI (myocardial infarction) ([place])   . Shortness of breath dyspnea   . Stroke [facility])     Past Surgical History:  Procedure Laterality Date  . CORONARY ANGIOPLASTY WITH STENT PLACEMENT    . LIVER SURGERY      Family Psychiatric History: Father-alcohol abuse.  Family History:  Family History  Problem Relation Age of Onset  . Alcohol abuse Father     Social History:   Social History   Socioeconomic History  . Marital status: Single    Spouse name: Not on file  . Number of children: 4  . Years of education: Not on file  . Highest education level: 11th grade  Occupational History    Comment:  disability  Social Needs  . Financial resource strain: Somewhat hard  . Food insecurity:    Worry: Often true    Inability: Often true  . Transportation needs:    Medical: No    Non-medical: No  Tobacco Use  . Smoking status: Current Every Day Smoker    Packs/day: 1.00    Types: Cigarettes  . Smokeless tobacco: Never Used  Substance and Sexual Activity  . Alcohol use: No  . Drug use: No  . Sexual activity: Yes  Lifestyle  . Physical activity:    Days per week: 0 days    Minutes per session: 0 min  . Stress: To some extent  Relationships  . Social connections:    Talks on phone: Not on file    Gets together: Not on file    Attends religious service: More than 4 times per year    Active member of club or organization: No    Attends meetings of clubs or organizations: Never    Relationship status: Divorced  Other Topics Concern  . Not on file  Social History Narrative  . Not on file    Additional Social History: Patient is divorced.  He lives  in [place] with his elderly mother.  Patient is on SSI.  Patient has 4 adult daughters.  He has 3 grandchildren.  Patient reports he has a good relationship with his children and grandchildren. Pt went up to 11th grade.  Does have a girlfriend however reports he does not have the motivation to see her or talk to her often.  Allergies:  No Known Allergies  Metabolic Disorder Labs: No results found for: HGBA1C, MPG No results found for: PROLACTIN No results found for: CHOL, TRIG, HDL, CHOLHDL, VLDL, LDLCALC   Current Medications: Current Outpatient Medications  Medication Sig Dispense Refill  . aspirin EC 81 MG tablet Take 81 mg by mouth daily.    Marland Kitchen atorvastatin (LIPITOR) 20 MG tablet Take 20 mg by mouth daily.    . clonazePAM (KLONOPIN) 0.5 MG tablet Take by mouth.    . clopidogrel (PLAVIX) 75 MG tablet Take 1 tablet by mouth daily.    Marland Kitchen ipratropium-albuterol (DUONEB) 0.5-2.5 (3) MG/3ML SOLN Inhale 3 mLs into the lungs 4 (four)  times daily as needed.    Marland Kitchen lisinopril (PRINIVIL,ZESTRIL) 5 MG tablet Take 1 tablet by mouth daily.    . nitroGLYCERIN (NITROSTAT) 0.4 MG SL tablet Place under the tongue.    Marland Kitchen oxyCODONE (OXY IR/ROXICODONE) 5 MG immediate release tablet Take by mouth.    . mirtazapine (REMERON) 7.5 MG tablet Take 1 tablet (7.5 mg total) by mouth at bedtime. For mood and sleep 30 tablet 1   No current facility-administered medications for this visit.     Neurologic: Headache: No Seizure: No Paresthesias:No  Musculoskeletal: Strength & Muscle Tone: within normal limits Gait & Station: normal Patient leans: N/A  Psychiatric Specialty Exam: Review of Systems  Constitutional: Positive for malaise/fatigue.  Psychiatric/Behavioral: Positive for depression. The patient is nervous/anxious and has insomnia.   All other systems reviewed and are negative.   Blood pressure 131/83, pulse 78, temperature 97.8 F (36.6 C), temperature source Oral, weight 186 lb 12.8 oz (84.7 kg).Body mass index is 25.33 kg/m.  General Appearance: Casual  Eye Contact:  Fair  Speech:  Clear and Coherent  Volume:  Normal  Mood:  Dysphoric  Affect:  Congruent  Thought Process:  Goal Directed and Descriptions of Associations: Intact  Orientation:  Full (Time, Place, and Person)  Thought Content:  Logical  Suicidal Thoughts:  No  Homicidal Thoughts:  No  Memory:  Immediate;   Fair Recent;   Fair Remote;   Fair  Judgement:  Fair  Insight:  Fair  Psychomotor Activity:  Normal  Concentration:  Concentration: Fair and Attention Span: Fair  Recall:  [employer] of Knowledge:Fair  Language: Fair  Akathisia:  No  Handed:  Right  AIMS (if indicated): denies tremors, rigidity  Assets:  Communication Skills Desire for Improvement Social Support  ADL's:  Intact  Cognition: WNL  Sleep:  poor    Treatment Plan Summary:Trevor Rose is a 57 year old Caucasian male who is divorced, on SSI, lives in [place], has a history of  neuroendocrine tumor currently on treatment, coronary artery disease, hypertension, COPD, TIA, insomnia, anxiety and depression, presented to the clinic today to establish care.  Patient is biologically predisposed given his multiple health problems.  Patient also has psychosocial stressors of the limitations due to his cancer diagnosis.  Patient is motivated to start treatment as well as pursue psychotherapy.  Patient filled out a mood disorder questionnaire and although he has a possible history of late onset mania which was diagnosed a year  ago , he scored very low.  Hence it is likely that his possible bizarre behavior and paranoia year ago could have been due to multiple etiologies including polypharmacy.  Patient currently denies any suicidality.  Patient is motivated to start treatment and pursue psychotherapy.  Plan as noted below. Medication management and Plan as noted below  Plan MDD PHQ 9 equals 13 Start mirtazapine 7.5 mg p.o. nightly Refer for psychotherapy with our therapist here in clinic.  For GAD GAD 7 equals 12 Patient to continue Klonopin as prescribed for now.  However discussed with patient the risk of being on benzodiazepine therapy long-term and also risk of combining it with medications like oxycodone.  Discussed with patient that his Klonopin needs to be gradually tapered down. Mirtazapine 7.5 mg p.o. nightly. Refer for CBT.  For insomnia Mirtazapine 7.5 mg p.o. nightly Discussed sleep hygiene.  Tobacco use disorder Provided smoking cessation counseling.  For Polysubstance abuse in sustained remission (alcohol, cocaine, cannabis) Patient stopped using several years ago.  We will continue to monitor closely.  I have reviewed labs in [facility]- Thyroid panel -05/20/2017-within normal limits.  Reviewed medical records in [facility] from his hospital admission at [facility]-05/20/2017.  Follow-up in clinic in 2 weeks or sooner if needed.  More than 50 % of the time was spent for  psychoeducation and supportive psychotherapy and care coordination.  This note was generated in part or whole with voice recognition software. Voice recognition is usually quite accurate but there are transcription errors that can and very often do occur. I apologize for any typographical errors that were not detected and corrected.     Ursula Alert, MD 11/12/201910:01 AM

## 2018-07-02 NOTE — Patient Instructions (Signed)
Mirtazapine tablets What is this medicine? MIRTAZAPINE (mir TAZ a peen) is used to treat depression. This medicine may be used for other purposes; ask your health care provider or pharmacist if you have questions. COMMON BRAND NAME(S): Remeron What should I tell my health care provider before I take this medicine? They need to know if you have any of these conditions: -bipolar disorder -glaucoma -kidney disease -liver disease -suicidal thoughts -an unusual or allergic reaction to mirtazapine, other medicines, foods, dyes, or preservatives -pregnant or trying to get pregnant -breast-feeding How should I use this medicine? Take this medicine by mouth with a glass of water. Follow the directions on the prescription label. Take your medicine at regular intervals. Do not take your medicine more often than directed. Do not stop taking this medicine suddenly except upon the advice of your doctor. Stopping this medicine too quickly may cause serious side effects or your condition may worsen. A special MedGuide will be given to you by the pharmacist with each prescription and refill. Be sure to read this information carefully each time. Talk to your pediatrician regarding the use of this medicine in children. Special care may be needed. Overdosage: If you think you have taken too much of this medicine contact a poison control center or emergency room at once. NOTE: This medicine is only for you. Do not share this medicine with others. What if I miss a dose? If you miss a dose, take it as soon as you can. If it is almost time for your next dose, take only that dose. Do not take double or extra doses. What may interact with this medicine? Do not take this medicine with any of the following medications: -linezolid -MAOIs like Carbex, Eldepryl, Marplan, Nardil, and Parnate -methylene blue (injected into a vein) This medicine may also interact with the following medications: -alcohol -antiviral  medicines for HIV or AIDS -certain medicines that treat or prevent blood clots like warfarin -certain medicines for depression, anxiety, or psychotic disturbances -certain medicines for fungal infections like ketoconazole and itraconazole -certain medicines for migraine headache like almotriptan, eletriptan, frovatriptan, naratriptan, rizatriptan, sumatriptan, zolmitriptan -certain medicines for seizures like carbamazepine or phenytoin -certain medicines for sleep -cimetidine -erythromycin -fentanyl -lithium -medicines for blood pressure -nefazodone -rasagiline -rifampin -supplements like St. John's wort, kava kava, valerian -tramadol -tryptophan This list may not describe all possible interactions. Give your health care provider a list of all the medicines, herbs, non-prescription drugs, or dietary supplements you use. Also tell them if you smoke, drink alcohol, or use illegal drugs. Some items may interact with your medicine. What should I watch for while using this medicine? Tell your doctor if your symptoms do not get better or if they get worse. Visit your doctor or health care professional for regular checks on your progress. Because it may take several weeks to see the full effects of this medicine, it is important to continue your treatment as prescribed by your doctor. Patients and their families should watch out for new or worsening thoughts of suicide or depression. Also watch out for sudden changes in feelings such as feeling anxious, agitated, panicky, irritable, hostile, aggressive, impulsive, severely restless, overly excited and hyperactive, or not being able to sleep. If this happens, especially at the beginning of treatment or after a change in dose, call your health care professional. You may get drowsy or dizzy. Do not drive, use machinery, or do anything that needs mental alertness until you know how this medicine affects you. Do not   stand or sit up quickly, especially if  you are an older patient. This reduces the risk of dizzy or fainting spells. Alcohol may interfere with the effect of this medicine. Avoid alcoholic drinks. This medicine may cause dry eyes and blurred vision. If you wear contact lenses you may feel some discomfort. Lubricating drops may help. See your eye doctor if the problem does not go away or is severe. Your mouth may get dry. Chewing sugarless gum or sucking hard candy, and drinking plenty of water may help. Contact your doctor if the problem does not go away or is severe. What side effects may I notice from receiving this medicine? Side effects that you should report to your doctor or health care professional as soon as possible: -allergic reactions like skin rash, itching or hives, swelling of the face, lips, or tongue -anxious -changes in vision -chest pain -confusion -elevated mood, decreased need for sleep, racing thoughts, impulsive behavior -eye pain -fast, irregular heartbeat -feeling faint or lightheaded, falls -feeling agitated, angry, or irritable -fever or chills, sore throat -hallucination, loss of contact with reality -loss of balance or coordination -mouth sores -redness, blistering, peeling or loosening of the skin, including inside the mouth -restlessness, pacing, inability to keep still -seizures -stiff muscles -suicidal thoughts or other mood changes -trouble passing urine or change in the amount of urine -trouble sleeping -unusual bleeding or bruising -unusually weak or tired -vomiting Side effects that usually do not require medical attention (report to your doctor or health care professional if they continue or are bothersome): -change in appetite -constipation -dizziness -dry mouth -muscle aches or pains -nausea -tired -weight gain This list may not describe all possible side effects. Call your doctor for medical advice about side effects. You may report side effects to FDA at 1-800-FDA-1088. Where  should I keep my medicine? Keep out of the reach of children. Store at room temperature between 15 and 30 degrees C (59 and 86 degrees F) Protect from light and moisture. Throw away any unused medicine after the expiration date. NOTE: This sheet is a summary. It may not cover all possible information. If you have questions about this medicine, talk to your doctor, pharmacist, or health care provider.  2018 Elsevier/Gold Standard (2016-01-07 17:30:45)  

## 2018-07-03 ENCOUNTER — Encounter: Payer: Self-pay | Admitting: Psychiatry

## 2018-07-16 ENCOUNTER — Ambulatory Visit (INDEPENDENT_AMBULATORY_CARE_PROVIDER_SITE_OTHER): Payer: Medicaid Other | Admitting: Psychiatry

## 2018-07-16 ENCOUNTER — Other Ambulatory Visit: Payer: Self-pay

## 2018-07-16 ENCOUNTER — Encounter: Payer: Self-pay | Admitting: Psychiatry

## 2018-07-16 VITALS — BP 137/80 | HR 78 | Temp 97.9°F | Wt 187.0 lb

## 2018-07-16 DIAGNOSIS — F330 Major depressive disorder, recurrent, mild: Secondary | ICD-10-CM

## 2018-07-16 DIAGNOSIS — F17200 Nicotine dependence, unspecified, uncomplicated: Secondary | ICD-10-CM

## 2018-07-16 DIAGNOSIS — F1211 Cannabis abuse, in remission: Secondary | ICD-10-CM

## 2018-07-16 DIAGNOSIS — F5105 Insomnia due to other mental disorder: Secondary | ICD-10-CM | POA: Diagnosis not present

## 2018-07-16 DIAGNOSIS — F411 Generalized anxiety disorder: Secondary | ICD-10-CM | POA: Diagnosis not present

## 2018-07-16 DIAGNOSIS — F1411 Cocaine abuse, in remission: Secondary | ICD-10-CM

## 2018-07-16 DIAGNOSIS — F1011 Alcohol abuse, in remission: Secondary | ICD-10-CM

## 2018-07-16 MED ORDER — MIRTAZAPINE 15 MG PO TABS: 15.0000 mg | ORAL_TABLET | Freq: Every day | ORAL | 1 refills | Status: DC

## 2018-07-16 NOTE — Progress Notes (Signed)
[facility] MD OP Progress Note  07/16/2018 5:48 PM [place]  MRN:  [id number]  Chief Complaint: 'I am here for follow up." Chief Complaint    Follow-up; Medication Refill     HPI: Trevor Rose is a 57 year old Caucasian male, divorced, on SSI, lives in [place], has a history of anxiety, depression, sleep problems, CAD, hypertension, COPD, TIA, stage IV neuroendocrine cancer with metastasis, presented to the clinic today for a follow-up visit.  Patient today reports he continues to struggle with anxiety and depressive symptoms.  He reports the mirtazapine may be helping to some extent.  He denies any side effects to the mirtazapine.  He reports sleep is improved.  He continues to wake up in the middle of the night mostly because of his pain.  Patient reports he continues to struggle with his multiple medical problems.  He has upcoming appointment with his neurologist the first week of December.  Patient reports the mirtazapine may have helped with his appetite some.  He however continues to struggle with appetite issues and some nausea. Pt used to be on Marinol in the past.  He however reports it made him drowsy.  He continues to be socially isolated.  He is the primary caregiver for his mother who needs help with ADLs on a regular basis.  He was referred for psychotherapy however due to his multiple appointments with other providers he has not seen the therapist yet.  He reports he is scheduled to see her soon.  Patient denies any suicidality or perceptual disturbances.  Patient denies any homicidality.  Patient denies any other concerns today. Visit Diagnosis:    ICD-10-CM   1. MDD (major depressive disorder), recurrent episode, mild (HCC) F33.0 mirtazapine (REMERON) 15 MG tablet  2. Insomnia due to mental condition F51.05 mirtazapine (REMERON) 15 MG tablet  3. GAD (generalized anxiety disorder) F41.1 mirtazapine (REMERON) 15 MG tablet  4. Tobacco use disorder F17.200   5. Cocaine use disorder, mild,  in sustained remission (HCC) F14.11   6. Cannabis use disorder, mild, in sustained remission F12.11   7. Alcohol use disorder, mild, in sustained remission F10.11     Past Psychiatric History: I have reviewed past psychiatric history from my progress note on 07/02/2018.  Past trials of Depakote, Haldol, Klonopin, Xanax  Past Medical History:  Past Medical History:  Diagnosis Date  . Anginal pain ([place])   . Anxiety   . Cancer ([place])    liver  . CHF (congestive heart failure) ([place])   . Coronary artery disease   . Depression   . Hypertension   . MI (myocardial infarction) ([place])   . Shortness of breath dyspnea   . Stroke [facility])     Past Surgical History:  Procedure Laterality Date  . CORONARY ANGIOPLASTY WITH STENT PLACEMENT    . LIVER SURGERY      Family Psychiatric History: Reviewed family psychiatric history from my progress note on 07/02/2018  Family History:  Family History  Problem Relation Age of Onset  . Alcohol abuse Father     Social History: Reviewed social history from my progress note on 07/02/2018 Social History   Socioeconomic History  . Marital status: Single    Spouse name: Not on file  . Number of children: 4  . Years of education: Not on file  . Highest education level: 11th grade  Occupational History    Comment: disability  Social Needs  . Financial resource strain: Somewhat hard  . Food insecurity:  Worry: Often true    Inability: Often true  . Transportation needs:    Medical: No    Non-medical: No  Tobacco Use  . Smoking status: Current Every Day Smoker    Packs/day: 1.00    Types: Cigarettes  . Smokeless tobacco: Never Used  Substance and Sexual Activity  . Alcohol use: No  . Drug use: No  . Sexual activity: Yes  Lifestyle  . Physical activity:    Days per week: 0 days    Minutes per session: 0 min  . Stress: To some extent  Relationships  . Social connections:    Talks on phone: Not on file    Gets together: Not on file     Attends religious service: More than 4 times per year    Active member of club or organization: No    Attends meetings of clubs or organizations: Never    Relationship status: Divorced  Other Topics Concern  . Not on file  Social History Narrative  . Not on file    Allergies: No Known Allergies  Metabolic Disorder Labs: No results found for: HGBA1C, MPG No results found for: PROLACTIN No results found for: CHOL, TRIG, HDL, CHOLHDL, VLDL, LDLCALC No results found for: TSH  Therapeutic Level Labs: No results found for: LITHIUM No results found for: VALPROATE No components found for:  CBMZ  Current Medications: Current Outpatient Medications  Medication Sig Dispense Refill  . aspirin EC 81 MG tablet Take 81 mg by mouth daily.    Marland Kitchen atorvastatin (LIPITOR) 20 MG tablet Take 20 mg by mouth daily.    . clonazePAM (KLONOPIN) 0.5 MG tablet Take by mouth.    . clopidogrel (PLAVIX) 75 MG tablet Take 1 tablet by mouth daily.    Marland Kitchen ipratropium-albuterol (DUONEB) 0.5-2.5 (3) MG/3ML SOLN Inhale 3 mLs into the lungs 4 (four) times daily as needed.    Marland Kitchen lisinopril (PRINIVIL,ZESTRIL) 5 MG tablet Take 1 tablet by mouth daily.    . mirtazapine (REMERON) 15 MG tablet Take 1 tablet (15 mg total) by mouth at bedtime. 30 tablet 1  . nitroGLYCERIN (NITROSTAT) 0.4 MG SL tablet Place under the tongue.    Marland Kitchen oxyCODONE (OXY IR/ROXICODONE) 5 MG immediate release tablet Take by mouth.     No current facility-administered medications for this visit.      Musculoskeletal: Strength & Muscle Tone: within normal limits Gait & Station: normal Patient leans: N/A  Psychiatric Specialty Exam: Review of Systems  Psychiatric/Behavioral: Positive for depression. The patient is nervous/anxious and has insomnia.   All other systems reviewed and are negative.   Blood pressure 137/80, pulse 78, temperature 97.9 F (36.6 C), temperature source Oral, weight 187 lb (84.8 kg).Body mass index is 25.36 kg/m.  General  Appearance: Casual  Eye Contact:  Fair  Speech:  Clear and Coherent  Volume:  Normal  Mood:  Anxious and Dysphoric  Affect:  Congruent  Thought Process:  Goal Directed and Descriptions of Associations: Intact  Orientation:  Full (Time, Place, and Person)  Thought Content: Logical   Suicidal Thoughts:  No  Homicidal Thoughts:  No  Memory:  Immediate;   Fair Recent;   Fair Remote;   Fair  Judgement:  Fair  Insight:  Fair  Psychomotor Activity:  Normal  Concentration:  Concentration: Fair and Attention Span: Fair  Recall:  [employer] of Knowledge: Fair  Language: Fair  Akathisia:  No  Handed:  Right  AIMS (if indicated): denies tremors, rigidity,stiffness  Assets:  Communication Skills Desire for Improvement Housing Social Support  ADL's:  Intact  Cognition: WNL  Sleep:  restless mostly due to pain   Screenings:   Assessment and Plan: Jaelin is a 57 year old Caucasian male who is divorced, on SSI, lives in [place], has a history of neuroendocrine tumor, coronary artery disease, hypertension, COPD, TIA, insomnia, anxiety, depression, presented to the clinic today for for a follow-up visit.  Patient is biologically predisposed given his multiple health problems.  Patient also has psychosocial stressors of  his cancer diagnosis , chronic pain.  Pt is also the primary caregiver for his mother who needs help with ADLs.  Patient  is socially isolated.  He was referred for psychotherapy-pending.  We will continue to make medication changes as noted below.  Plan MDD Increase mirtazapine to 15 mg p.o. Nightly For psychotherapy.  For GAD Mirtazapine 15 mg p.o. nightly Continue Klonopin as prescribed.  Discussed with patient to limit use of Klonopin as much as possible and discussed that he can gradually be tapered down.  He is aware of about the risk of being on long-term benzodiazepine therapy and also the risk of combining it with oxycodone.  For insomnia Mirtazapine 15 mg p.o.  nightly  Tobacco use disorder Provided smoking cessation counseling.  For polysubstance abuse in sustained remission-alcohol, cocaine, cannabis. Patient stopped using several years ago.  We will continue to monitor closely.  Follow-up in clinic in 4 weeks or sooner if needed  More than 50 % of the time was spent for psychoeducation and supportive psychotherapy and care coordination.  This note was generated in part or whole with voice recognition software. Voice recognition is usually quite accurate but there are transcription errors that can and very often do occur. I apologize for any typographical errors that were not detected and corrected.       Ursula Alert, MD 07/16/2018, 5:48 PM

## 2018-07-30 ENCOUNTER — Ambulatory Visit: Payer: Medicaid Other | Admitting: Licensed Clinical Social Worker

## 2018-08-13 ENCOUNTER — Encounter: Payer: Self-pay | Admitting: Psychiatry

## 2018-08-13 ENCOUNTER — Ambulatory Visit (INDEPENDENT_AMBULATORY_CARE_PROVIDER_SITE_OTHER): Payer: Medicaid Other | Admitting: Psychiatry

## 2018-08-13 ENCOUNTER — Other Ambulatory Visit: Payer: Self-pay

## 2018-08-13 VITALS — BP 131/79 | HR 61 | Temp 97.8°F | Wt 192.2 lb

## 2018-08-13 DIAGNOSIS — F17200 Nicotine dependence, unspecified, uncomplicated: Secondary | ICD-10-CM

## 2018-08-13 DIAGNOSIS — F5105 Insomnia due to other mental disorder: Secondary | ICD-10-CM

## 2018-08-13 DIAGNOSIS — F411 Generalized anxiety disorder: Secondary | ICD-10-CM | POA: Diagnosis not present

## 2018-08-13 DIAGNOSIS — F1411 Cocaine abuse, in remission: Secondary | ICD-10-CM

## 2018-08-13 DIAGNOSIS — F1011 Alcohol abuse, in remission: Secondary | ICD-10-CM

## 2018-08-13 DIAGNOSIS — F1211 Cannabis abuse, in remission: Secondary | ICD-10-CM

## 2018-08-13 DIAGNOSIS — F330 Major depressive disorder, recurrent, mild: Secondary | ICD-10-CM

## 2018-08-13 MED ORDER — CLONAZEPAM 0.5 MG PO TABS: 0.2500 mg | ORAL_TABLET | Freq: Two times a day (BID) | ORAL | 0 refills | Status: DC

## 2018-08-13 MED ORDER — BUSPIRONE HCL 10 MG PO TABS: 10.0000 mg | ORAL_TABLET | Freq: Three times a day (TID) | ORAL | 1 refills | Status: AC

## 2018-08-13 MED ORDER — MIRTAZAPINE 30 MG PO TABS: 30.0000 mg | ORAL_TABLET | Freq: Every day | ORAL | 1 refills | Status: AC

## 2018-08-13 NOTE — Progress Notes (Addendum)
[place] MD OP Progress Note  08/13/2018 5:06 PM [place]  MRN:  [id number]  Chief Complaint: ' I am here for follow up.' Chief Complaint    Follow-up; Medication Refill     HPI: Trevor Rose is a 57 year old Caucasian male, divorced, on SSI, lives in [place], has a history of anxiety, depression, sleep problems, coronary artery disease, hypertension, COPD, TIA, stage IV neuroendocrine cancer with metastasis, presented to the clinic today for a follow-up visit.  Patient today appears to be irritable.  He reports his medications as not effective.  Patient reports he wants to be continued on his Klonopin since he continues to have panic attacks.  Patient was referred for psychotherapy sessions which  he has not started yet.  He has upcoming appointment in January.  Discussed with patient that benzodiazepines are not long-term solutions for his panic symptoms.  Discussed with him the adverse effect of being on benzodiazepines for long-term.  Discussed with him his medications like mirtazapine can be readjusted and other medication like BuSpar can be added to augment it.  Patient appeared to be resistant to  medication changes initially today.  Some time was spent providing medication education. Patient later on agreed to make these changes.  Also discussed with patient about the importance of having regular psychotherapy visits.  He agrees with plan.  Patient denies suicidality or homicidality.  Patient denies any perceptual disturbances.   Visit Diagnosis:    ICD-10-CM   1. MDD (major depressive disorder), recurrent episode, mild (HCC) F33.0 mirtazapine (REMERON) 30 MG tablet    busPIRone (BUSPAR) 10 MG tablet  2. Insomnia due to mental condition F51.05 mirtazapine (REMERON) 30 MG tablet    busPIRone (BUSPAR) 10 MG tablet  3. GAD (generalized anxiety disorder) F41.1   4. Tobacco use disorder F17.200   5. Cocaine use disorder, mild, in sustained remission (HCC) F14.11   6. Cannabis use disorder,  mild, in sustained remission F12.11   7. Alcohol use disorder, mild, in sustained remission F10.11     Past Psychiatric History: I have reviewed past psychiatric history from my progress note on 07/02/2018.  Past trials of Depakote Xanax Haldol Klonopin  Past Medical History:  Past Medical History:  Diagnosis Date  . Anginal pain ([place])   . Anxiety   . Cancer ([place])    liver  . CHF (congestive heart failure) ([place])   . Coronary artery disease   . Depression   . Hypertension   . MI (myocardial infarction) ([place])   . Shortness of breath dyspnea   . Stroke [facility])     Past Surgical History:  Procedure Laterality Date  . CORONARY ANGIOPLASTY WITH STENT PLACEMENT    . LIVER SURGERY      Family Psychiatric History: I have reviewed family psychiatric history from a progress note on 07/02/2018.  Family History:  Family History  Problem Relation Age of Onset  . Alcohol abuse Father     Social History:  Social History   Socioeconomic History  . Marital status: Single    Spouse name: Not on file  . Number of children: 4  . Years of education: Not on file  . Highest education level: 11th grade  Occupational History    Comment: disability  Social Needs  . Financial resource strain: Somewhat hard  . Food insecurity:    Worry: Often true    Inability: Often true  . Transportation needs:    Medical: No    Non-medical: No  Tobacco Use  .  Smoking status: Current Every Day Smoker    Packs/day: 1.00    Types: Cigarettes  . Smokeless tobacco: Never Used  Substance and Sexual Activity  . Alcohol use: No  . Drug use: No  . Sexual activity: Yes  Lifestyle  . Physical activity:    Days per week: 0 days    Minutes per session: 0 min  . Stress: To some extent  Relationships  . Social connections:    Talks on phone: Not on file    Gets together: Not on file    Attends religious service: More than 4 times per year    Active member of club or organization: No    Attends meetings  of clubs or organizations: Never    Relationship status: Divorced  Other Topics Concern  . Not on file  Social History Narrative  . Not on file    Allergies: No Known Allergies  Metabolic Disorder Labs: No results found for: HGBA1C, MPG No results found for: PROLACTIN No results found for: CHOL, TRIG, HDL, CHOLHDL, VLDL, LDLCALC No results found for: TSH  Therapeutic Level Labs: No results found for: LITHIUM No results found for: VALPROATE No components found for:  CBMZ  Current Medications: Current Outpatient Medications  Medication Sig Dispense Refill  . aspirin EC 81 MG tablet Take 81 mg by mouth daily.    Marland Kitchen atorvastatin (LIPITOR) 20 MG tablet Take 20 mg by mouth daily.    Derrill Memo ON 08/22/2018] clonazePAM (KLONOPIN) 0.5 MG tablet Take 0.5-1 tablets (0.25-0.5 mg total) by mouth 2 (two) times daily. 56 tablet 0  . clopidogrel (PLAVIX) 75 MG tablet Take 1 tablet by mouth daily.    Marland Kitchen ipratropium-albuterol (DUONEB) 0.5-2.5 (3) MG/3ML SOLN Inhale 3 mLs into the lungs 4 (four) times daily as needed.    Marland Kitchen lisinopril (PRINIVIL,ZESTRIL) 5 MG tablet Take 1 tablet by mouth daily.    . nitroGLYCERIN (NITROSTAT) 0.4 MG SL tablet Place under the tongue.    Marland Kitchen oxyCODONE (OXY IR/ROXICODONE) 5 MG immediate release tablet Take by mouth.    . busPIRone (BUSPAR) 10 MG tablet Take 1 tablet (10 mg total) by mouth 3 (three) times daily. 90 tablet 1  . mirtazapine (REMERON) 30 MG tablet Take 1 tablet (30 mg total) by mouth at bedtime. 30 tablet 1   No current facility-administered medications for this visit.      Musculoskeletal: Strength & Muscle Tone: within normal limits Gait & Station: normal Patient leans: N/A  Psychiatric Specialty Exam: Review of Systems  Psychiatric/Behavioral: The patient is nervous/anxious and has insomnia.   All other systems reviewed and are negative.   Blood pressure 131/79, pulse 61, temperature 97.8 F (36.6 C), temperature source Oral, weight 192 lb 3.2 oz  (87.2 kg).Body mass index is 26.07 kg/m.  General Appearance: Casual  Eye Contact:  Fair  Speech:  Clear and Coherent  Volume:  Normal  Mood:  Anxious  Affect:  Congruent  Thought Process:  Goal Directed and Descriptions of Associations: Intact  Orientation:  Full (Time, Place, and Person)  Thought Content: Logical   Suicidal Thoughts:  No  Homicidal Thoughts:  No  Memory:  Immediate;   Fair Recent;   Fair Remote;   Fair  Judgement:  Fair  Insight:  Fair  Psychomotor Activity:  Normal  Concentration:  Concentration: Fair and Attention Span: Fair  Recall:  [employer] of Knowledge: Fair  Language: Fair  Akathisia:  No  Handed:  Right  AIMS (if indicated):  denies tremors, rigidity, stiffness  Assets:  Communication Skills Desire for Improvement Housing  ADL's:  Intact  Cognition: WNL  Sleep:  Poor   Screenings:   Assessment and Plan:    Bynum is a 57 year old Caucasian male who is divorced, on SSI, lives in [place], has a history of neuroendocrine tumor, coronary artery disease, hypertension, COPD, TIA, insomnia, anxiety, depression, presented to the clinic today for a follow-up visit.  Patient is biologically predisposed given his multiple health problems.  He also has psychosocial stressors of his cancer diagnosis, chronic pain.  Patient is also the primary caregiver for his mother who needs help with ADLs.  Patient continues to be anxious.  He reports medications as not very beneficial at this time.  Discussed the following medication changes with patient.  Plan MDD Increase mirtazapine to 30 mg p.o. nightly I have referred patient for psychotherapy sessions-has upcoming appointment in January.  For GAD Increase mirtazapine to 30 mg p.o. nightly Discussed with patient to taper off Klonopin gradually.  Patient to cut Klonopin 0.5 mg into half on Monday and Friday a.m.  Rest of the time he will continue the same dosage as he is taking now.  Some time was spent and educated  patient about the risk of being on long-term benzodiazepine therapy.  Also discussed with him the risk of combining it with oxycodone. I have given him a script with date specified. I have reviewed [place]  For insomnia Patient reports his sleep is affected due to pain.  Discussed with patient to follow-up with his pain provider to help with his pain. Mirtazapine as prescribed.  I have reviewed [place] controlled substance database.  Follow-up in clinic in 2 weeks or sooner if needed.  More than 50 % of the time was spent for psychoeducation and supportive psychotherapy and care coordination.  This note was generated in part or whole with voice recognition software. Voice recognition is usually quite accurate but there are transcription errors that can and very often do occur. I apologize for any typographical errors that were not detected and corrected.       Ursula Alert, MD 08/13/2018, 5:06 PM

## 2018-08-13 NOTE — Patient Instructions (Signed)
Klonopin- Please start taking half of 0.5 mg ( 0.25 mg ) atleast 2 days - mondays and fridays in the AM. Rest of the time please continue your normal dosage.

## 2018-08-29 ENCOUNTER — Ambulatory Visit: Payer: Medicaid Other | Admitting: Licensed Clinical Social Worker

## 2018-09-13 ENCOUNTER — Ambulatory Visit (INDEPENDENT_AMBULATORY_CARE_PROVIDER_SITE_OTHER): Payer: Medicaid Other | Admitting: Psychiatry

## 2018-09-13 ENCOUNTER — Other Ambulatory Visit: Payer: Self-pay

## 2018-09-13 ENCOUNTER — Encounter: Payer: Self-pay | Admitting: Psychiatry

## 2018-09-13 VITALS — BP 142/90 | HR 61 | Temp 97.5°F | Wt 189.4 lb

## 2018-09-13 DIAGNOSIS — F5105 Insomnia due to other mental disorder: Secondary | ICD-10-CM | POA: Diagnosis not present

## 2018-09-13 DIAGNOSIS — F1411 Cocaine abuse, in remission: Secondary | ICD-10-CM

## 2018-09-13 DIAGNOSIS — F411 Generalized anxiety disorder: Secondary | ICD-10-CM | POA: Diagnosis not present

## 2018-09-13 DIAGNOSIS — F17200 Nicotine dependence, unspecified, uncomplicated: Secondary | ICD-10-CM

## 2018-09-13 DIAGNOSIS — F330 Major depressive disorder, recurrent, mild: Secondary | ICD-10-CM | POA: Diagnosis not present

## 2018-09-13 MED ORDER — HYDROXYZINE PAMOATE 25 MG PO CAPS: 25.0000 mg | ORAL_CAPSULE | Freq: Three times a day (TID) | ORAL | 1 refills | Status: AC | PRN

## 2018-09-13 NOTE — Progress Notes (Signed)
[facility] MD OP Progress Note  09/13/2018 5:52 PM Trevor Rose  MRN:  [id number]  Chief Complaint:  Chief Complaint    Follow-up; Medication Refill    ' I am here for follow up."   HPI: Trevor Rose is a 58 year old Caucasian male, divorced, on SSI, lives in [place], has a history of anxiety, depression, sleep problems, coronary artery disease, hypertension, COPD, TIA, stage IV neuroendocrine cancer with metastases, presented to clinic today for a follow-up visit.  Patient today reports he continues to feel depressed.  He describes his depressive symptoms as low energy and lack of motivation.  He continues to be compliant on the mirtazapine as prescribed.  He reports it may be helping to some extent however he continues to struggle.  He also reports lack of appetite which is also due to his multiple medical issues including his cancer.  Patient reports the mirtazapine does not helpful with his appetite problem.  Discussed adding olanzapine however patient declines.  He reports he wants to give the medication more time.  Patient continues to struggle with anxiety symptoms on a regular basis.  He however reports the anxiety symptoms is better on the current medication regimen.  Patient reports he struggles with sleep on a regular basis due to his pain.  Discussed with patient to reach out to his pain provider for management of his pain.  He declines any sleep medications at this time.  Discussed starting psychotherapy sessions and he has an upcoming appointment tomorrow with therapist here in clinic.  Patient initially was resistant however later on reports he will keep the appointment.   Visit Diagnosis:    ICD-10-CM   1. MDD (major depressive disorder), recurrent episode, mild (HCC) F33.0 hydrOXYzine (VISTARIL) 25 MG capsule  2. Insomnia due to mental condition F51.05 hydrOXYzine (VISTARIL) 25 MG capsule  3. GAD (generalized anxiety disorder) F41.1 hydrOXYzine (VISTARIL) 25 MG capsule  4. Tobacco use  disorder F17.200   5. Cocaine use disorder, mild, in sustained remission ([place]) F14.11     Past Psychiatric History: Reviewed past psychiatric history from my progress note on 07/02/2018.  Past trials of Depakote, Xanax, Haldol, Klonopin.  Past Medical History:  Past Medical History:  Diagnosis Date  . Anginal pain ([place])   . Anxiety   . Cancer ([place])    liver  . CHF (congestive heart failure) ([place])   . Coronary artery disease   . Depression   . Hypertension   . MI (myocardial infarction) ([place])   . Shortness of breath dyspnea   . Stroke [facility])     Past Surgical History:  Procedure Laterality Date  . CORONARY ANGIOPLASTY WITH STENT PLACEMENT    . LIVER SURGERY      Family Psychiatric History: I have reviewed family psychiatric history from my progress note on 07/02/2018.  Family History:  Family History  Problem Relation Age of Onset  . Alcohol abuse Father     Social History: I have reviewed social history from my progress note on 07/02/2018. Social History   Socioeconomic History  . Marital status: Single    Spouse name: Not on file  . Number of children: 4  . Years of education: Not on file  . Highest education level: 11th grade  Occupational History    Comment: disability  Social Needs  . Financial resource strain: Somewhat hard  . Food insecurity:    Worry: Often true    Inability: Often true  . Transportation needs:    Medical: No  Non-medical: No  Tobacco Use  . Smoking status: Current Every Day Smoker    Packs/day: 1.00    Types: Cigarettes  . Smokeless tobacco: Never Used  Substance and Sexual Activity  . Alcohol use: No  . Drug use: No  . Sexual activity: Yes  Lifestyle  . Physical activity:    Days per week: 0 days    Minutes per session: 0 min  . Stress: To some extent  Relationships  . Social connections:    Talks on phone: Not on file    Gets together: Not on file    Attends religious service: More than 4 times per year    Active  member of club or organization: No    Attends meetings of clubs or organizations: Never    Relationship status: Divorced  Other Topics Concern  . Not on file  Social History Narrative  . Not on file    Allergies: No Known Allergies  Metabolic Disorder Labs: No results found for: HGBA1C, MPG No results found for: PROLACTIN No results found for: CHOL, TRIG, HDL, CHOLHDL, VLDL, LDLCALC No results found for: TSH  Therapeutic Level Labs: No results found for: LITHIUM No results found for: VALPROATE No components found for:  CBMZ  Current Medications: Current Outpatient Medications  Medication Sig Dispense Refill  . aspirin EC 81 MG tablet Take 81 mg by mouth daily.    Marland Kitchen atorvastatin (LIPITOR) 20 MG tablet Take 20 mg by mouth daily.    . busPIRone (BUSPAR) 10 MG tablet Take 1 tablet (10 mg total) by mouth 3 (three) times daily. 90 tablet 1  . clopidogrel (PLAVIX) 75 MG tablet Take 1 tablet by mouth daily.    . hydrOXYzine (VISTARIL) 25 MG capsule Take 1 capsule (25 mg total) by mouth 3 (three) times daily as needed. For severe anxiety symptoms 90 capsule 1  . ipratropium-albuterol (DUONEB) 0.5-2.5 (3) MG/3ML SOLN Inhale 3 mLs into the lungs 4 (four) times daily as needed.    Marland Kitchen lisinopril (PRINIVIL,ZESTRIL) 5 MG tablet Take 1 tablet by mouth daily.    . mirtazapine (REMERON) 30 MG tablet Take 1 tablet (30 mg total) by mouth at bedtime. 30 tablet 1  . nitroGLYCERIN (NITROSTAT) 0.4 MG SL tablet Place under the tongue.    Marland Kitchen oxyCODONE (OXY IR/ROXICODONE) 5 MG immediate release tablet Take by mouth.     No current facility-administered medications for this visit.      Musculoskeletal: Strength & Muscle Tone: within normal limits Gait & Station: normal Patient leans: N/A  Psychiatric Specialty Exam: Review of Systems  Musculoskeletal: Positive for back pain and myalgias.  Psychiatric/Behavioral: Positive for depression. The patient is nervous/anxious and has insomnia.   All other  systems reviewed and are negative.   Blood pressure (!) 142/90, pulse 61, temperature (!) 97.5 F (36.4 C), temperature source Oral, weight 189 lb 6.4 oz (85.9 kg).Body mass index is 25.69 kg/m.  General Appearance: Casual  Eye Contact:  Fair  Speech:  Clear and Coherent  Volume:  Normal  Mood:  Anxious and Depressed  Affect:  Appropriate  Thought Process:  Goal Directed and Descriptions of Associations: Intact  Orientation:  Full (Time, Place, and Person)  Thought Content: Logical   Suicidal Thoughts:  No  Homicidal Thoughts:  No  Memory:  Immediate;   Fair Recent;   Fair Remote;   Fair  Judgement:  Fair  Insight:  Fair  Psychomotor Activity:  Normal  Concentration:  Concentration: Fair and Attention Span:  Fair  Recall:  [employer] of Knowledge: Fair  Language: Fair  Akathisia:  No  Handed:  Right  AIMS (if indicated): Denies tremors, rigidity, stiffness  Assets:  Communication Skills Desire for Improvement Housing  ADL's:  Intact  Cognition: WNL  Sleep:  restless due to pain   Screenings:   Assessment and Plan: Jaimon is a 58 year old Caucasian male who is divorced, on SSI, lives in [place], has a history of neuroendocrine tumor, coronary artery disease, hypertension, COPD, TIA, insomnia, anxiety and depression, presented to clinic today for a follow-up visit.  Patient is biologically predisposed given his multiple health problems.  He also has psychosocial stressors.  Patient continued to need medication readjustment for his depressive symptoms.  Continue plan as noted below.  Plan MDD-unstable Mirtazapine 30 mg p.o. nightly. Patient declines further readjustment of his medication today. Discussed with him to start psychotherapy session she has therapy appointment tomorrow.  GAD-unstable Mirtazapine 30 mg p.o. nightly Discontinue Klonopin.  Patient reports he ran out a week ago.  He denies any withdrawal symptoms. Add hydroxyzine 12.5 to 25 mg p.o. 3 times daily PRN.   Discussed with patient to limit use.  For insomnia-unstable Mirtazapine as prescribed. Patient declines other sleep aids at this time. Discussed with patient to reach out to his pain provider to get a better control of his pain which is causing him sleep issues.  Follow-up in clinic in 4 weeks or sooner if needed.  I have spent atleast 15 minutes  face to face with patient today. More than 50 % of the time was spent for psychoeducation and supportive psychotherapy and care coordination.  This note was generated in part or whole with voice recognition software. Voice recognition is usually quite accurate but there are transcription errors that can and very often do occur. I apologize for any typographical errors that were not detected and corrected.       Ursula Alert, MD 09/13/2018, 5:52 PM

## 2018-09-13 NOTE — Patient Instructions (Signed)
Hydroxyzine capsules or tablets What is this medicine? HYDROXYZINE (hye DROX i zeen) is an antihistamine. This medicine is used to treat allergy symptoms. It is also used to treat anxiety and tension. This medicine can be used with other medicines to induce sleep before surgery. This medicine may be used for other purposes; ask your health care provider or pharmacist if you have questions. COMMON BRAND NAME(S): ANX, Atarax, Rezine, Vistaril What should I tell my health care provider before I take this medicine? They need to know if you have any of these conditions: -glaucoma -heart disease -history of irregular heartbeat -kidney disease -liver disease -lung or breathing disease, like asthma -stomach or intestine problems -thyroid disease -trouble passing urine -an unusual or allergic reaction to hydroxyzine, cetirizine, other medicines, foods, dyes or preservatives -pregnant or trying to get pregnant -breast-feeding How should I use this medicine? Take this medicine by mouth with a full glass of water. Follow the directions on the prescription label. You may take this medicine with food or on an empty stomach. Take your medicine at regular intervals. Do not take your medicine more often than directed. Talk to your pediatrician regarding the use of this medicine in children. Special care may be needed. While this drug may be prescribed for children as young as 6 years of age for selected conditions, precautions do apply. Patients over 65 years old may have a stronger reaction and need a smaller dose. Overdosage: If you think you have taken too much of this medicine contact a poison control center or emergency room at once. NOTE: This medicine is only for you. Do not share this medicine with others. What if I miss a dose? If you miss a dose, take it as soon as you can. If it is almost time for your next dose, take only that dose. Do not take double or extra doses. What may interact with this  medicine? Do not take this medicine with any of the following medications: -cisapride -dofetilide -dronedarone -pimozide -thioridazine This medicine may also interact with the following medications: -alcohol -antihistamines for allergy, cough, and cold -atropine -barbiturate medicines for sleep or seizures, like phenobarbital -certain antibiotics like erythromycin or clarithromycin -certain medicines for anxiety or sleep -certain medicines for bladder problems like oxybutynin, tolterodine -certain medicines for depression or psychotic disturbances -certain medicines for irregular heart beat -certain medicines for Parkinson's disease like benztropine, trihexyphenidyl -certain medicines for seizures like phenobarbital, primidone -certain medicines for stomach problems like dicyclomine, hyoscyamine -certain medicines for travel sickness like scopolamine -ipratropium -narcotic medicines for pain -other medicines that prolong the QT interval (an abnormal heart rhythm) This list may not describe all possible interactions. Give your health care provider a list of all the medicines, herbs, non-prescription drugs, or dietary supplements you use. Also tell them if you smoke, drink alcohol, or use illegal drugs. Some items may interact with your medicine. What should I watch for while using this medicine? Tell your doctor or health care professional if your symptoms do not improve. You may get drowsy or dizzy. Do not drive, use machinery, or do anything that needs mental alertness until you know how this medicine affects you. Do not stand or sit up quickly, especially if you are an older patient. This reduces the risk of dizzy or fainting spells. Alcohol may interfere with the effect of this medicine. Avoid alcoholic drinks. Your mouth may get dry. Chewing sugarless gum or sucking hard candy, and drinking plenty of water may help. Contact your doctor if   the problem does not go away or is  severe. This medicine may cause dry eyes and blurred vision. If you wear contact lenses you may feel some discomfort. Lubricating drops may help. See your eye doctor if the problem does not go away or is severe. If you are receiving skin tests for allergies, tell your doctor you are using this medicine. What side effects may I notice from receiving this medicine? Side effects that you should report to your doctor or health care professional as soon as possible: -allergic reactions like skin rash, itching or hives, swelling of the face, lips, or tongue -changes in vision -confusion -fast, irregular heartbeat -seizures -tremor -trouble passing urine or change in the amount of urine Side effects that usually do not require medical attention (report to your doctor or health care professional if they continue or are bothersome): -constipation -drowsiness -dry mouth -headache -tiredness This list may not describe all possible side effects. Call your doctor for medical advice about side effects. You may report side effects to FDA at 1-800-FDA-1088. Where should I keep my medicine? Keep out of the reach of children. Store at room temperature between 15 and 30 degrees C (59 and 86 degrees F). Keep container tightly closed. Throw away any unused medicine after the expiration date. NOTE: This sheet is a summary. It may not cover all possible information. If you have questions about this medicine, talk to your doctor, pharmacist, or health care provider.  2019 Elsevier/Gold Standard (2018-02-19 13:25:13)  

## 2018-09-14 ENCOUNTER — Ambulatory Visit: Payer: Medicaid Other | Admitting: Licensed Clinical Social Worker

## 2018-10-23 ENCOUNTER — Ambulatory Visit: Payer: Medicaid Other | Admitting: Psychiatry
